# Patient Record
Sex: Male | Born: 1990 | Race: White | Hispanic: Yes | Marital: Married | Smoking: Former smoker
Health system: Southern US, Community
[De-identification: ages and names within clinical notes are randomized; demographics above are authoritative.]

## PROBLEM LIST (undated history)

## (undated) ENCOUNTER — Emergency Department: Admission: EM | Payer: 59 | Source: Home / Self Care

## (undated) DIAGNOSIS — M109 Gout, unspecified: Secondary | ICD-10-CM

## (undated) DIAGNOSIS — R569 Unspecified convulsions: Secondary | ICD-10-CM

## (undated) HISTORY — PX: TONSILLECTOMY: SUR1361

## (undated) HISTORY — PX: VASECTOMY: SHX75

---

## 2019-02-11 DIAGNOSIS — M79671 Pain in right foot: Secondary | ICD-10-CM | POA: Insufficient documentation

## 2019-02-11 DIAGNOSIS — M1009 Idiopathic gout, multiple sites: Secondary | ICD-10-CM | POA: Insufficient documentation

## 2019-02-11 DIAGNOSIS — M79672 Pain in left foot: Secondary | ICD-10-CM | POA: Insufficient documentation

## 2019-02-11 DIAGNOSIS — Z6839 Body mass index (BMI) 39.0-39.9, adult: Secondary | ICD-10-CM | POA: Insufficient documentation

## 2019-02-11 DIAGNOSIS — E669 Obesity, unspecified: Secondary | ICD-10-CM | POA: Insufficient documentation

## 2019-08-19 ENCOUNTER — Ambulatory Visit: Payer: 59 | Attending: Internal Medicine

## 2020-01-09 DIAGNOSIS — M7030 Other bursitis of elbow, unspecified elbow: Secondary | ICD-10-CM | POA: Insufficient documentation

## 2020-03-23 DIAGNOSIS — Z9852 Vasectomy status: Secondary | ICD-10-CM | POA: Insufficient documentation

## 2020-04-15 ENCOUNTER — Emergency Department (HOSPITAL_COMMUNITY): Payer: 59

## 2020-04-15 ENCOUNTER — Encounter (HOSPITAL_COMMUNITY): Payer: Self-pay

## 2020-04-15 ENCOUNTER — Other Ambulatory Visit: Payer: Self-pay

## 2020-04-15 ENCOUNTER — Emergency Department (HOSPITAL_COMMUNITY)
Admission: EM | Admit: 2020-04-15 | Discharge: 2020-04-15 | Disposition: A | Discharge status: Home / Self Care | Payer: 59 | Attending: Emergency Medicine | Admitting: Emergency Medicine

## 2020-04-15 ENCOUNTER — Other Ambulatory Visit (HOSPITAL_COMMUNITY): Payer: 59

## 2020-04-15 DIAGNOSIS — R110 Nausea: Secondary | ICD-10-CM | POA: Diagnosis not present

## 2020-04-15 DIAGNOSIS — F1590 Other stimulant use, unspecified, uncomplicated: Secondary | ICD-10-CM | POA: Insufficient documentation

## 2020-04-15 DIAGNOSIS — R569 Unspecified convulsions: Secondary | ICD-10-CM | POA: Insufficient documentation

## 2020-04-15 DIAGNOSIS — R519 Headache, unspecified: Secondary | ICD-10-CM | POA: Diagnosis not present

## 2020-04-15 HISTORY — DX: Gout, unspecified: M10.9

## 2020-04-15 LAB — RAPID URINE DRUG SCREEN, HOSP PERFORMED
Amphetamines: NOT DETECTED
Barbiturates: NOT DETECTED
Benzodiazepines: NOT DETECTED
Cocaine: NOT DETECTED
Opiates: NOT DETECTED
Tetrahydrocannabinol: POSITIVE — AB

## 2020-04-15 LAB — COMPREHENSIVE METABOLIC PANEL
ALT: 47 U/L — ABNORMAL HIGH (ref 0–44)
AST: 33 U/L (ref 15–41)
Albumin: 4.4 g/dL (ref 3.5–5.0)
Alkaline Phosphatase: 59 U/L (ref 38–126)
Anion gap: 10 (ref 5–15)
BUN: 12 mg/dL (ref 6–20)
CO2: 21 mmol/L — ABNORMAL LOW (ref 22–32)
Calcium: 9.6 mg/dL (ref 8.9–10.3)
Chloride: 107 mmol/L (ref 98–111)
Creatinine, Ser: 1.04 mg/dL (ref 0.61–1.24)
GFR, Estimated: >60 mL/min (ref >60)
Glucose, Bld: 148 mg/dL — ABNORMAL HIGH (ref 70–99)
Potassium: 4.1 mmol/L (ref 3.5–5.1)
Sodium: 138 mmol/L (ref 135–145)
Total Bilirubin: 0.7 mg/dL (ref 0.3–1.2)
Total Protein: 7.8 g/dL (ref 6.5–8.1)

## 2020-04-15 LAB — CBG MONITORING, ED: Glucose-Capillary: 159 mg/dL — ABNORMAL HIGH (ref 70–99)

## 2020-04-15 LAB — URINALYSIS, ROUTINE W REFLEX MICROSCOPIC
Bacteria, UA: NONE SEEN
Bilirubin Urine: NEGATIVE
Glucose, UA: NEGATIVE mg/dL
Ketones, ur: NEGATIVE mg/dL
Leukocytes,Ua: NEGATIVE
Nitrite: NEGATIVE
Protein, ur: 30 mg/dL — AB
Specific Gravity, Urine: 1.012 (ref 1.005–1.030)
pH: 5 (ref 5.0–8.0)

## 2020-04-15 LAB — CBC WITH DIFFERENTIAL/PLATELET
Abs Immature Granulocytes: 0.06 10*3/uL (ref 0.00–0.07)
Basophils Absolute: 0 10*3/uL (ref 0.0–0.1)
Basophils Relative: 0 %
Eosinophils Absolute: 0.3 10*3/uL (ref 0.0–0.5)
Eosinophils Relative: 3 %
HCT: 46 % (ref 39.0–52.0)
Hemoglobin: 15.4 g/dL (ref 13.0–17.0)
Immature Granulocytes: 1 %
Lymphocytes Relative: 16 %
Lymphs Abs: 1.6 10*3/uL (ref 0.7–4.0)
MCH: 30.9 pg (ref 26.0–34.0)
MCHC: 33.5 g/dL (ref 30.0–36.0)
MCV: 92.2 fL (ref 80.0–100.0)
Monocytes Absolute: 0.4 10*3/uL (ref 0.1–1.0)
Monocytes Relative: 4 %
Neutro Abs: 7.6 10*3/uL (ref 1.7–7.7)
Neutrophils Relative %: 76 %
Platelets: 227 10*3/uL (ref 150–400)
RBC: 4.99 MIL/uL (ref 4.22–5.81)
RDW: 12.4 % (ref 11.5–15.5)
WBC: 10 10*3/uL (ref 4.0–10.5)
nRBC: 0 % (ref 0.0–0.2)

## 2020-04-15 LAB — ACETAMINOPHEN LEVEL: Acetaminophen (Tylenol), Serum: <10 ug/mL — ABNORMAL LOW (ref 10–30)

## 2020-04-15 LAB — MAGNESIUM: Magnesium: 2.2 mg/dL (ref 1.7–2.4)

## 2020-04-15 LAB — SALICYLATE LEVEL: Salicylate Lvl: <7 mg/dL — ABNORMAL LOW (ref 7.0–30.0)

## 2020-04-15 MED ORDER — LEVETIRACETAM 750 MG PO TABS: 750.0000 mg | ORAL_TABLET | Freq: Two times a day (BID) | ORAL | 0 refills | Status: DC

## 2020-04-15 MED ORDER — SODIUM CHLORIDE 0.9 % IV BOLUS
1000.0000 mL | Freq: Once | INTRAVENOUS | Status: AC
  Administered 2020-04-15: 1000 mL via INTRAVENOUS

## 2020-04-15 MED ORDER — LEVETIRACETAM 750 MG PO TABS
750.0000 mg | ORAL_TABLET | Freq: Once | ORAL | Status: DC
  Filled 2020-04-15 (×3): qty 1

## 2020-04-15 MED ORDER — SODIUM CHLORIDE 0.9 % IV SOLN: INTRAVENOUS | Status: DC

## 2020-04-15 NOTE — Discharge Instructions (Addendum)
It was our pleasure to provide your ER care today - we hope that you feel better.  Follow up with neurologist in the coming week - call office tomorrow AM to arrange appointment.   Take keppra as prescribed - discuss medication with neurologist at follow up.  No driving, swimming, operating heavy machinery, or potentially dangerous activity until cleared to do so by your doctor/neurologist.  Return to ER if worse, new symptoms, new or severe pain, recurrent seizures, trouble breathing, or other concern.

## 2020-04-15 NOTE — ED Notes (Signed)
Pt in MRI unable to reasses vitals.

## 2020-04-15 NOTE — ED Notes (Signed)
Dr. Haviland at bedside. 

## 2020-04-15 NOTE — Progress Notes (Signed)
STAT EEG complete - results pending. ? ?

## 2020-04-15 NOTE — ED Notes (Signed)
This RN went with pt to and from CT.

## 2020-04-15 NOTE — Procedures (Signed)
Patient Name: Craig Davis.  MRN: [id number]  EEG Attending: Ritta Slot  Referring Physician/Provider: Marisue Humble Date: 04/15/2020 Duration: 27 minutes  Patient history: 29 yo M being evaluated for concern for seizure  Level of alertness: awake, asleep  AEDs during EEG study: None  Technical aspects: This EEG study was done with scalp electrodes positioned according to the 10-20 International system of electrode placement. Electrical activity was acquired at a sampling rate of 500Hz  and reviewed with a high frequency filter of 70Hz  and a low frequency filter of 1Hz . EEG data were recorded continuously and digitally stored.   BACKGROUND ACTIVITY: Posterior dominant rhythm: The posterior dominant rhythm consists of 9 Hz activity of moderate voltage (25-35 uV) seen predominantly in posterior head regions, symmetric and reactive to eye opening and eye closing.          Slowing: None  EPILEPTIFORM ACTIVITY: Interictal epileptiform activity: None  Ictal Activity: None  OTHER EVENTS: None  SLEEP RECORDINGS:  Sleep was recorded with symmetrically distributed structures.   ACTIVATION PROCEDURES:  Hyperventilation and photic stimulation were performed without abnormality.   IMPRESSION: This study is within normal limits. There was no seizure or evidence of seizure predisposition recorded on this study. Please note that lack of epileptiform activity on EEG does not preclude the possibility of epilepsy.    , MD [facility] [phone]  If 7pm- 7am, please page neurology on call as listed in AMION.

## 2020-04-15 NOTE — Progress Notes (Signed)
Pt is in MRI will try back later for EEG.

## 2020-04-15 NOTE — Consult Note (Signed)
Neurology Consult H&P  CC: First seizure  History is obtained from: patient and his wife  HPI: Craig Davis. is a 29 y.o. male right handed was sleeping and around 0630 noted by his wife to to be shaking all over. She describes flexion of arms and bilateral clonic extension of both legs with tongue biting on left followed by prolonged postictal. No urine/bowel incontinence.   He had associated mild headache and both of his legs are extremely sore especially the calves. No FH seizure except for niece after HIE. Good sleep. Occasionally smokes marijuana and denies other illicits and tobacco. Currently very stressed as they are buying a house.  ROS: A complete ROS was performed and is negative except as noted in the HPI.    Past Medical History:  Diagnosis Date  . Gout    Social History:  reports current drug use. Drug: Marijuana. No history on file for tobacco use and alcohol use.  Prior to Admission medications   Not on File   Exam: Current vital signs: BP 116/81   Pulse 73   Temp 97.6 F (36.4 C) (Oral)   Resp 18   Ht 6\' 3"  (1.905 m)   Wt 127 kg   SpO2 96%   BMI 35.00 kg/m   Physical Exam  Constitutional: Appears well-developed and well-nourished.  Psych: Affect appropriate to situation Eyes: No scleral injection HENT: No OP obstrucion Head: Normocephalic.  Cardiovascular: Normal rate and regular rhythm.  Respiratory: Effort normal and breath sounds normal to anterior ascultation GI: Soft.  No distension. There is no tenderness.  Skin: WDI  Neuro: Mental Status: Patient is awake, alert, oriented to person, place, month, year, and situation. Patient is able to give a clear and coherent history. No signs of aphasia or neglect. Cranial Nerves: II: Visual Fields are full. Pupils are equal, round, and reactive to light. III,IV, VI: EOMI without ptosis or diploplia.  V: Facial sensation is symmetric to temperature VII: Facial movement is symmetric.   VIII: hearing is intact to voice X: Uvula elevates symmetrically XI: Shoulder shrug is symmetric. XII: tongue is midline without atrophy or fasciculations.  Motor: Tone is normal. Bulk is normal. 5/5 strength was present in all four extremities. Sensory: Sensation is symmetric to light touch and temperature in the arms and legs. Deep Tendon Reflexes: 2+ and symmetric in the biceps and patellae. Plantars: Toes are downgoing bilaterally. Cerebellar: FNF and HKS are intact bilaterally.  I have reviewed labs in epic and the pertinent results are: No significant lab abnormalities CBC, BMP within normal reference range.  I have reviewed the images obtained: NCT head showed no acute ischemic changes, mass, hemorrhage.  Assessment: Craig Davis. is a 29 y.o. male no sig pmhx with first time seizure. From the limited semiology suggests seizure. Not clear if focal to bilateral versus generalized.  Plan: - 3T MRI brain wo cont - epilepsy protocol thin slices through temporal lobes.  - rEEG. - If either study is suggestive consider starting levetiracetam 750mg  two times daily.  Electronically signed by: Dr. 38 Pager: [phone] 04/15/2020, 1:56 PM

## 2020-04-15 NOTE — ED Notes (Signed)
Pt given water 

## 2020-04-15 NOTE — ED Provider Notes (Addendum)
Signed out by Dr Particia Nearing that workup complete except eeg. If eeg neg for seizure, d/c to home, start on keppra 750 mg bid, and neurology f/u as outpt.   EEG ready as negative/normal by neurologist. Pt asymptomatic, asking for food/drink - provided.  Pt currently appears stable for d/c.       Cathren Laine, MD 04/15/20 1921

## 2020-04-15 NOTE — ED Notes (Signed)
Pt states that he feels much better. Ambulating to restroom without assistance or difficulty.

## 2020-04-15 NOTE — ED Notes (Addendum)
Pt now complaining of light sensitivity, increasing headache, and nausea.  Dr. Particia Nearing made aware.

## 2020-04-15 NOTE — ED Triage Notes (Signed)
Per [facility]: Pt from home. This AM wife saw pt "shaking all over"  And not responsive followed by 10 minutes of confusion. NO hx of seizures. Complained of headache, nausea, and vomiting. Initial BP was 180/100. Pt "barely" has a headache now. Pt given 4 mg IV zofran with EMS.  Pt ambulated to restroom upon arrival to ED.  Pt denies alcohol use, denies drug use with the exception of some marijuana. Pt has bitten his tongue PTA.

## 2020-04-15 NOTE — ED Notes (Signed)
Pt back from CT

## 2020-04-15 NOTE — ED Notes (Signed)
Patient transported to MRI 

## 2020-04-15 NOTE — ED Provider Notes (Signed)
[facility] EMERGENCY DEPARTMENT Provider Note   CSN: [id number] Arrival date & time: 04/15/20  8315     History Chief Complaint  Patient presents with  . Seizures    Craig Davis. is a 29 y.o. male.  Pt presents to the ED today with a seizure.  Pt and his wife were in bed.  She woke up because he was shaking all over.  He would not respond, so she called EMS.  Pt was confused for about 10 minutes and is now back to nl.  He was ambulatory upon arrival to the ED.  Pt has no seizure hx.  He said he felt fine when he went to bed.  He c/o headache and nausea to EMS.  He was given 4 mg of zofran en route.  Pt's bp elevated for EMS, but is back to nl now.  No f/c.        Past Medical History:  Diagnosis Date  . Gout     There are no problems to display for this patient.   Past Surgical History:  Procedure Laterality Date  . TONSILLECTOMY    . VASECTOMY         No family history on file.  Social History   Tobacco Use  . Smoking status: Not on file  Substance Use Topics  . Alcohol use: Not on file  . Drug use: Yes    Types: Marijuana    Home Medications Prior to Admission medications   Not on File    Allergies    Patient has no known allergies.  Review of Systems   Review of Systems  Gastrointestinal: Positive for nausea and vomiting.  Neurological: Positive for seizures.  All other systems reviewed and are negative.   Physical Exam Updated Vital Signs BP 116/81   Pulse 73   Temp 97.6 F (36.4 C) (Oral)   Resp 18   Ht 6\' 3"  (1.905 m)   Wt 127 kg   SpO2 96%   BMI 35.00 kg/m   Physical Exam Vitals and nursing note reviewed.  Constitutional:      Appearance: Normal appearance.  HENT:     Head: Normocephalic and atraumatic.     Right Ear: External ear normal.     Left Ear: External ear normal.     Nose: Nose normal.     Mouth/Throat:     Mouth: Mucous membranes are moist.     Comments: Bite to left side of  tongue Eyes:     Extraocular Movements: Extraocular movements intact.     Conjunctiva/sclera: Conjunctivae normal.     Pupils: Pupils are equal, round, and reactive to light.  Cardiovascular:     Rate and Rhythm: Normal rate and regular rhythm.     Pulses: Normal pulses.     Heart sounds: Normal heart sounds.  Pulmonary:     Effort: Pulmonary effort is normal.     Breath sounds: Normal breath sounds.  Abdominal:     General: Abdomen is flat. Bowel sounds are normal.     Palpations: Abdomen is soft.  Musculoskeletal:        General: Normal range of motion.     Cervical back: Normal range of motion and neck supple.  Skin:    General: Skin is warm.     Capillary Refill: Capillary refill takes less than 2 seconds.  Neurological:     General: No focal deficit present.     Mental Status: He is  alert and oriented to person, place, and time.  Psychiatric:        Mood and Affect: Mood normal.        Behavior: Behavior normal.     ED Results / Procedures / Treatments   Labs (all labs ordered are listed, but only abnormal results are displayed) Labs Reviewed  COMPREHENSIVE METABOLIC PANEL - Abnormal; Notable for the following components:      Result Value   CO2 21 (*)    Glucose, Bld 148 (*)    ALT 47 (*)    All other components within normal limits  SALICYLATE LEVEL - Abnormal; Notable for the following components:   Salicylate Lvl <7.0 (*)    All other components within normal limits  ACETAMINOPHEN LEVEL - Abnormal; Notable for the following components:   Acetaminophen (Tylenol), Serum <10 (*)    All other components within normal limits  RAPID URINE DRUG SCREEN, HOSP PERFORMED - Abnormal; Notable for the following components:   Tetrahydrocannabinol POSITIVE (*)    All other components within normal limits  URINALYSIS, ROUTINE W REFLEX MICROSCOPIC - Abnormal; Notable for the following components:   Hgb urine dipstick SMALL (*)    Protein, ur 30 (*)    All other components  within normal limits  CBG MONITORING, ED - Abnormal; Notable for the following components:   Glucose-Capillary 159 (*)    All other components within normal limits  CBC WITH DIFFERENTIAL/PLATELET  MAGNESIUM    EKG EKG Interpretation  Date/Time:  Sunday April 15 2020 08:18:44 EST Ventricular Rate:  92 PR Interval:    QRS Duration: 99 QT Interval:  351 QTC Calculation: 435 R Axis:   44 Text Interpretation: Sinus rhythm ST elev, probable normal early repol pattern No old tracing to compare Confirmed by Jacalyn Lefevre [phone]) on 04/15/2020 8:44:29 AM   Radiology CT HEAD WO CONTRAST  Result Date: 04/15/2020 CLINICAL DATA:  Seizure EXAM: CT HEAD WITHOUT CONTRAST TECHNIQUE: Contiguous axial images were obtained from the base of the skull through the vertex without intravenous contrast. COMPARISON:  None. FINDINGS: Brain: No evidence of acute infarction, hemorrhage, hydrocephalus, extra-axial collection or mass lesion/mass effect. Vascular: No hyperdense vessel or unexpected calcification. Skull: Normal. Negative for fracture or focal lesion. Sinuses/Orbits: Mucosal thickening of the LEFT greater than RIGHT maxillary sinus. Other: None. IMPRESSION: No acute intracranial abnormality. Electronically Signed   By: Meda Klinefelter MD   On: 04/15/2020 09:56   DG Chest Port 1 View  Result Date: 04/15/2020 CLINICAL DATA:  Seizure. EXAM: PORTABLE CHEST 1 VIEW COMPARISON:  None. FINDINGS: 0847 hours. Low lung volumes. The lungs are clear without focal pneumonia, edema, pneumothorax or pleural effusion. The cardiopericardial silhouette is within normal limits for size. The visualized bony structures of the thorax show no acute abnormality. Telemetry leads overlie the chest. IMPRESSION: Low volume chest without acute cardiopulmonary findings. Electronically Signed   By: Kennith Center M.D.   On: 04/15/2020 09:26    Procedures Procedures (including critical care time)  Medications Ordered in  ED Medications  sodium chloride 0.9 % bolus 1,000 mL (0 mLs Intravenous Stopped 04/15/20 1002)    And  0.9 %  sodium chloride infusion (has no administration in time range)    ED Course  I have reviewed the triage vital signs and the nursing notes.  Pertinent labs & imaging results that were available during my care of the patient were reviewed by me and considered in my medical decision making (see chart for details).  MDM Rules/Calculators/A&P                          Pt d/w Dr. Thomasena Edis (neurology) who recommended a MRI and an EEG.  MRI and EEG are pending at shift change.  If the EEG or MRI suggests a seizure d/o, he recommends starting keppra 750 mg bid.  Pt signed out to Dr. Denton Lank at shift change. Final Clinical Impression(s) / ED Diagnoses Final diagnoses:  Seizure [facility])    Rx / DC Orders ED Discharge Orders    None       Jacalyn Lefevre, MD 04/15/20 [id number]

## 2020-05-02 ENCOUNTER — Ambulatory Visit: Payer: 59 | Admitting: Podiatry

## 2020-05-07 ENCOUNTER — Ambulatory Visit: Payer: 59 | Admitting: Podiatry

## 2020-05-07 ENCOUNTER — Ambulatory Visit (INDEPENDENT_AMBULATORY_CARE_PROVIDER_SITE_OTHER): Payer: 59

## 2020-05-07 ENCOUNTER — Encounter: Payer: Self-pay | Admitting: Podiatry

## 2020-05-07 ENCOUNTER — Other Ambulatory Visit: Payer: Self-pay

## 2020-05-07 DIAGNOSIS — M722 Plantar fascial fibromatosis: Secondary | ICD-10-CM | POA: Diagnosis not present

## 2020-05-07 DIAGNOSIS — R569 Unspecified convulsions: Secondary | ICD-10-CM | POA: Insufficient documentation

## 2020-05-07 DIAGNOSIS — M1A0790 Idiopathic chronic gout, unspecified ankle and foot, without tophus (tophi): Secondary | ICD-10-CM | POA: Diagnosis not present

## 2020-05-07 MED ORDER — MELOXICAM 15 MG PO TABS: 15.0000 mg | ORAL_TABLET | Freq: Every day | ORAL | 3 refills | Status: DC

## 2020-05-07 NOTE — Progress Notes (Signed)
  Subjective:  Patient ID: Craig Burly., male    DOB: 1990/07/05,  MRN: [id number] HPI Chief Complaint  Patient presents with  . Foot Pain    Plantar heels bilateral and other multiple areas - aching x years, severity of pain comes and goes, history of gout, AM pain, takes Allopurnil daily, some ankle pain, wife massages  . New Patient (Initial Visit)    29 y.o. male presents with the above complaint.   ROS: Denies fever chills nausea vomiting muscle aches pains calf pain back pain chest pain shortness of breath.  Past Medical History:  Diagnosis Date  . Gout    Past Surgical History:  Procedure Laterality Date  . TONSILLECTOMY    . VASECTOMY      Current Outpatient Medications:  .  allopurinol (ZYLOPRIM) 100 MG tablet, Take 100 mg by mouth daily., Disp: , Rfl:  .  diazepam (VALIUM) 10 MG tablet, Take 10 mg by mouth 2 (two) times daily as needed., Disp: , Rfl:  .  diclofenac Sodium (VOLTAREN) 1 % GEL, Apply 1 application topically 4 (four) times daily., Disp: , Rfl:  .  levETIRAcetam (KEPPRA) 750 MG tablet, Take 1 tablet (750 mg total) by mouth 2 (two) times daily., Disp: 60 tablet, Rfl: 0 .  meloxicam (MOBIC) 15 MG tablet, Take 1 tablet (15 mg total) by mouth daily., Disp: 30 tablet, Rfl: 3  No Known Allergies Review of Systems Objective:  There were no vitals filed for this visit.  General: Well developed, nourished, in no acute distress, alert and oriented x3   Dermatological: Skin is warm, dry and supple bilateral. Nails x 10 are well maintained; remaining integument appears unremarkable at this time. There are no open sores, no preulcerative lesions, no rash or signs of infection present.  Vascular: Dorsalis Pedis artery and Posterior Tibial artery pedal pulses are 2/4 bilateral with immedate capillary fill time. Pedal hair growth present. No varicosities and no lower extremity edema present bilateral.   Neruologic: Grossly intact via light touch bilateral.  Vibratory intact via tuning fork bilateral. Protective threshold with Semmes Wienstein monofilament intact to all pedal sites bilateral. Patellar and Achilles deep tendon reflexes 2+ bilateral. No Babinski or clonus noted bilateral.   Musculoskeletal: No gross boney pedal deformities bilateral. No pain, crepitus, or limitation noted with foot and ankle range of motion bilateral. Muscular strength 5/5 in all groups tested bilateral.  No reproducible pain at this point.  Though this could very well been gouty arthritis.  Mild pes planovalgus bilateral.  Gait: Unassisted, Nonantalgic.    Radiographs:  Radiographs taken today demonstrate an osseously mature individual with no acute findings.  Plan fascia appears normal.  Mild pes planovalgus bilateral.  Osteoarthritic changes subtalar joints bilateral.  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    Plan: Started him on meloxicam and will get his arthritic profile performed.     Craig Davis T. [place], [place]

## 2020-05-08 LAB — CBC WITH DIFFERENTIAL/PLATELET
Basophils Absolute: 0.1 10*3/uL (ref 0.0–0.2)
Basos: 1 %
EOS (ABSOLUTE): 0.3 10*3/uL (ref 0.0–0.4)
Eos: 4 %
Hematocrit: 42.7 % (ref 37.5–51.0)
Hemoglobin: 15.2 g/dL (ref 13.0–17.7)
Immature Grans (Abs): 0 10*3/uL (ref 0.0–0.1)
Immature Granulocytes: 0 %
Lymphocytes Absolute: 3 10*3/uL (ref 0.7–3.1)
Lymphs: 33 %
MCH: 31.9 pg (ref 26.6–33.0)
MCHC: 35.6 g/dL (ref 31.5–35.7)
MCV: 90 fL (ref 79–97)
Monocytes Absolute: 0.7 10*3/uL (ref 0.1–0.9)
Monocytes: 8 %
Neutrophils Absolute: 5 10*3/uL (ref 1.4–7.0)
Neutrophils: 54 %
Platelets: 273 10*3/uL (ref 150–450)
RBC: 4.76 x10E6/uL (ref 4.14–5.80)
RDW: 12 % (ref 11.6–15.4)
WBC: 9.1 10*3/uL (ref 3.4–10.8)

## 2020-05-08 LAB — RHEUMATOID FACTOR: Rheumatoid fact SerPl-aCnc: <10 IU/mL (ref <14.0)

## 2020-05-08 LAB — URIC ACID: Uric Acid: 9.9 mg/dL — ABNORMAL HIGH (ref 3.8–8.4)

## 2020-05-08 LAB — ANA: Anti Nuclear Antibody (ANA): NEGATIVE

## 2020-05-08 LAB — SEDIMENTATION RATE: Sed Rate: 10 mm/hr (ref 0–15)

## 2020-05-08 LAB — C-REACTIVE PROTEIN: CRP: 3 mg/L (ref 0–10)

## 2020-05-18 ENCOUNTER — Telehealth: Payer: Self-pay

## 2020-05-18 NOTE — Telephone Encounter (Signed)
Patient has been notified of results.  He stated that he did start the Allopurinol and Meloxicam as Dr. Al Corpus recommended and he is doing better, has not had any problems other than feeling tired and a little achy after work.  He will follow up with Dr. Al Corpus on 06/06/2020

## 2020-05-18 NOTE — Telephone Encounter (Signed)
-----   Message from Elinor Parkinson, [place] sent at 05/09/2020  7:12 AM EST ----- Uric acid levels are high.  Needs to get back on the allopurinol and stay on it.

## 2020-05-28 ENCOUNTER — Ambulatory Visit: Payer: 59 | Admitting: Diagnostic Neuroimaging

## 2020-05-28 ENCOUNTER — Encounter: Payer: Self-pay | Admitting: Diagnostic Neuroimaging

## 2020-05-28 VITALS — BP 118/74 | HR 74 | Ht 75.0 in | Wt 296.0 lb

## 2020-05-28 DIAGNOSIS — R569 Unspecified convulsions: Secondary | ICD-10-CM | POA: Diagnosis not present

## 2020-05-28 NOTE — Patient Instructions (Signed)
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   - Please maintain precautions. Do not participate in activities where a loss of awareness could harm you or someone else. No swimming alone, no tub bathing, no hot tubs, no driving, no operating motorized vehicles (cars, ATVs, motocycles, etc), lawnmowers, power tools or firearms. No standing at heights, such as rooftops, ladders or stairs. Avoid hot objects such as stoves, heaters, open fires. Wear a helmet when riding a bicycle, scooter, skateboard, etc. and avoid areas of traffic. Set your water heater to 120 degrees or less.

## 2020-05-28 NOTE — Progress Notes (Signed)
[facility]  PATIENT: Craig Davis. DOB: Oct 24, 1990  REFERRING CLINICIAN: No ref. provider found HISTORY FROM: patient  REASON FOR VISIT: new consult    HISTORICAL  CHIEF COMPLAINT:  Chief Complaint  Patient presents with  . Seizures    Rm 7 New Pt "possible seizure 04/15/20 witnessed by wife, doesn't remember but had N/V afterwards, bit his tongue"    HISTORY OF PRESENT ILLNESS:   29 year old male here for evaluation of seizure.  04/15/2020 patient was asleep when he had a seizure that was witnessed by wife.  He had some tongue biting, sore muscles and nausea afterwards.  He was having some trouble getting his words out initially after the seizure.  Patient went to the hospital for evaluation.  Neurology consult was obtained.  Although EEG and MRI were normal, for some reason patient was prescribed levetiracetam 7 or she is milligrams twice a day.  Patient did not fill this prescription.  Since that time he is doing well.  No further events.  Possible triggering factors include increased stress and increased work leading up to this event.  Patient was drinking 2 energy drinks per day and working 2 jobs.  No family history of seizure.  No similar prior events.   REVIEW OF SYSTEMS: Full 14 system review of systems performed and negative with exception of: As per HPI.  ALLERGIES: No Known Allergies  HOME MEDICATIONS: Outpatient Medications Prior to Visit  Medication Sig Dispense Refill  . allopurinol (ZYLOPRIM) 100 MG tablet Take 100 mg by mouth daily.    . meloxicam (MOBIC) 15 MG tablet Take 1 tablet (15 mg total) by mouth daily. 30 tablet 3  . diclofenac Sodium (VOLTAREN) 1 % GEL Apply 1 application topically 4 (four) times daily. (Patient not taking: Reported on 05/28/2020)    . levETIRAcetam (KEPPRA) 750 MG tablet Take 1 tablet (750 mg total) by mouth 2 (two) times daily. (Patient not taking: Reported on 05/28/2020) 60 tablet 0  . diazepam  (VALIUM) 10 MG tablet Take 10 mg by mouth 2 (two) times daily as needed.     No facility-administered medications prior to visit.    PAST MEDICAL HISTORY: Past Medical History:  Diagnosis Date  . Gout     PAST SURGICAL HISTORY: Past Surgical History:  Procedure Laterality Date  . TONSILLECTOMY    . VASECTOMY      FAMILY HISTORY: Family History  Problem Relation Age of Onset  . Autism Brother     SOCIAL HISTORY: Social History   Socioeconomic History  . Marital status: Married    Spouse name: Duwayne Heck  . Number of children: 1  . Years of education: Not on file  . Highest education level: High school graduate  Occupational History  . Not on file  Tobacco Use  . Smoking status: Never Smoker  . Smokeless tobacco: Never Used  . Tobacco comment: 2 years when younger  Substance and Sexual Activity  . Alcohol use: Yes    Comment: 05/28/20 not often  . Drug use: Yes    Types: Marijuana    Comment: 05/28/20 3 x week  . Sexual activity: Not on file  Other Topics Concern  . Not on file  Social History Narrative   Lives with wife, child   05/28/20 Caffeine- was drinking ES energy drinks 1-2 daily, now 1 c coffee a day   Social Determinants of Health   Financial Resource Strain: Not on file  Food Insecurity: Not on file  Transportation Needs: Not on file  Physical Activity: Not on file  Stress: Not on file  Social Connections: Not on file  Intimate Partner Violence: Not on file     PHYSICAL EXAM  GENERAL EXAM/CONSTITUTIONAL: Vitals:  Vitals:   05/28/20 0812  BP: 118/74  Pulse: 74  Weight: 296 lb (134.3 kg)  Height: 6\' 3"  (1.905 m)     Body mass index is 37 kg/m. Wt Readings from Last 3 Encounters:  05/28/20 296 lb (134.3 kg)  04/15/20 280 lb (127 kg)     Patient is in no distress; well developed, nourished and groomed; neck is supple  CARDIOVASCULAR:  Examination of carotid arteries is normal; no carotid bruits  Regular rate and rhythm, no  murmurs  Examination of peripheral vascular system by observation and palpation is normal  EYES:  Ophthalmoscopic exam of optic discs and posterior segments is normal; no papilledema or hemorrhages  No exam data present  MUSCULOSKELETAL:  Gait, strength, tone, movements noted in Neurologic exam below  NEUROLOGIC: MENTAL STATUS:  No flowsheet data found.  awake, alert, oriented to person, place and time  recent and remote memory intact  normal attention and concentration  language fluent, comprehension intact, naming intact  fund of knowledge appropriate  CRANIAL NERVE:   2nd - no papilledema on fundoscopic exam  2nd, 3rd, 4th, 6th - pupils equal and reactive to light, visual fields full to confrontation, extraocular muscles intact, no nystagmus  5th - facial sensation symmetric  7th - facial strength symmetric  8th - hearing intact  9th - palate elevates symmetrically, uvula midline  11th - shoulder shrug symmetric  12th - tongue protrusion midline  MOTOR:   normal bulk and tone, full strength in the BUE, BLE  SENSORY:   normal and symmetric to light touch, temperature, vibration  COORDINATION:   finger-nose-finger, fine finger movements normal  REFLEXES:   deep tendon reflexes present and symmetric  GAIT/STATION:   narrow based gait     DIAGNOSTIC DATA (LABS, IMAGING, TESTING) - I reviewed patient records, labs, notes, testing and imaging myself where available.  Lab Results  Component Value Date   WBC 9.1 05/07/2020   HGB 15.2 05/07/2020   HCT 42.7 05/07/2020   MCV 90 05/07/2020   PLT 273 05/07/2020      Component Value Date/Time   NA 138 04/15/2020 0826   K 4.1 04/15/2020 0826   CL 107 04/15/2020 0826   CO2 21 (L) 04/15/2020 0826   GLUCOSE 148 (H) 04/15/2020 0826   BUN 12 04/15/2020 0826   CREATININE 1.04 04/15/2020 0826   CALCIUM 9.6 04/15/2020 0826   PROT 7.8 04/15/2020 0826   ALBUMIN 4.4 04/15/2020 0826   AST 33  04/15/2020 0826   ALT 47 (H) 04/15/2020 0826   ALKPHOS 59 04/15/2020 0826   BILITOT 0.7 04/15/2020 0826   GFRNONAA >60 04/15/2020 0826   No results found for: CHOL, HDL, LDLCALC, LDLDIRECT, TRIG, CHOLHDL No results found for: 04/17/2020 No results found for: VITAMINB12 No results found for: TSH    04/15/20 MRI brain 1. Partially empty sella, often an incidental finding though can be seen with idiopathic intracranial hypertension. 2. Otherwise unremarkable appearance of the brain. No etiology of seizures identified.  04/15/20 EEG  - normal    ASSESSMENT AND PLAN  29 y.o. year old male here with new onset single unprovoked seizure on 04/15/2020 during sleep.  MRI and EEG were normal.   Dx:  1. New onset seizure (HCC)  PLAN:  NEW ONSET SEIZURE (single, unprovoked; 04/15/20)  - monitor for now (no anti-seizure meds)   - According to [place] law, you can not drive unless you are seizure / syncope free for at least 6 months and under physician's care.   - Please maintain precautions. Do not participate in activities where a loss of awareness could harm you or someone else. No swimming alone, no tub bathing, no hot tubs, no driving, no operating motorized vehicles (cars, ATVs, motocycles, etc), lawnmowers, power tools or firearms. No standing at heights, such as rooftops, ladders or stairs. Avoid hot objects such as stoves, heaters, open fires. Wear a helmet when riding a bicycle, scooter, skateboard, etc. and avoid areas of traffic. Set your water heater to 120 degrees or less.   Return for pending if symptoms worsen or fail to improve.    Suanne Marker, MD 05/28/2020, 9:02 AM Certified in Neurology, Neurophysiology and Neuroimaging  [facility] [address] [place], [place] [address] [phone]

## 2020-06-06 ENCOUNTER — Ambulatory Visit: Payer: 59 | Admitting: Podiatry

## 2020-06-06 ENCOUNTER — Other Ambulatory Visit: Payer: 59 | Admitting: Orthotics

## 2020-06-18 ENCOUNTER — Ambulatory Visit: Payer: 59 | Admitting: Podiatry

## 2020-06-20 ENCOUNTER — Encounter: Payer: Self-pay | Admitting: Podiatry

## 2020-06-20 ENCOUNTER — Other Ambulatory Visit: Payer: Self-pay

## 2020-06-20 ENCOUNTER — Ambulatory Visit: Payer: 59 | Admitting: Podiatry

## 2020-06-20 DIAGNOSIS — M1A0720 Idiopathic chronic gout, left ankle and foot, without tophus (tophi): Secondary | ICD-10-CM | POA: Diagnosis not present

## 2020-06-20 DIAGNOSIS — M19072 Primary osteoarthritis, left ankle and foot: Secondary | ICD-10-CM | POA: Diagnosis not present

## 2020-06-20 NOTE — Patient Instructions (Addendum)
Continue taking the meloxicam as needed  Continue taking the allopurinol and managing your gout with your primary care doctor    Low-Purine Eating Plan A low-purine eating plan involves making food choices to limit your intake of purine. Purine is a kind of uric acid. Too much uric acid in your blood can cause certain conditions, such as gout and kidney stones. Eating a low-purine diet can help control these conditions. What are tips for following this plan? Reading food labels  Avoid foods with saturated or Trans fat.  Check the ingredient list of grains-based foods, such as bread and cereal, to make sure that they contain whole grains.  Check the ingredient list of sauces or soups to make sure they do not contain meat or fish.  When choosing soft drinks, check the ingredient list to make sure they do not contain high-fructose corn syrup. Shopping  Buy plenty of fresh fruits and vegetables.  Avoid buying canned or fresh fish.  Buy dairy products labeled as low-fat or nonfat.  Avoid buying premade or processed foods. These foods are often high in fat, salt (sodium), and added sugar.   Cooking  Use olive oil instead of butter when cooking. Oils like olive oil, canola oil, and sunflower oil contain healthy fats. Meal planning  Learn which foods do or do not affect you. If you find out that a food tends to cause your gout symptoms to flare up, avoid eating that food. You can enjoy foods that do not cause problems. If you have any questions about a food item, talk with your dietitian or health care provider.  Limit foods high in fat, especially saturated fat. Fat makes it harder for your body to get rid of uric acid.  Choose foods that are lower in fat and are lean sources of protein. General guidelines  Limit alcohol intake to no more than 1 drink a day for nonpregnant women and 2 drinks a day for men. One drink equals 12 oz of beer, 5 oz of wine, or 1 oz of hard liquor. Alcohol  can affect the way your body gets rid of uric acid.  Drink plenty of water to keep your urine clear or pale yellow. Fluids can help remove uric acid from your body.  If directed by your health care provider, take a vitamin C supplement.  Work with your health care provider and dietitian to develop a plan to achieve or maintain a healthy weight. Losing weight can help reduce uric acid in your blood. What foods are recommended? The items listed may not be a complete list. Talk with your dietitian about what dietary choices are best for you. Foods low in purines Foods low in purines do not need to be limited. These include:  All fruits.  All low-purine vegetables, pickles, and olives.  Breads, pasta, rice, cornbread, and popcorn. Cake and other baked goods.  All dairy foods.  Eggs, nuts, and nut butters.  Spices and condiments, such as salt, herbs, and vinegar.  Plant oils, butter, and margarine.  Water, sugar-free soft drinks, tea, coffee, and cocoa.  Vegetable-based soups, broths, sauces, and gravies. Foods moderate in purines Foods moderate in purines should be limited to the amounts listed.   cup of asparagus, cauliflower, spinach, mushrooms, or green peas, each day.  2/3 cup uncooked oatmeal, each day.   cup dry wheat bran or wheat germ, each day.  2-3 ounces of meat or poultry, each day.  4-6 ounces of shellfish, such as crab, lobster,  oysters, or shrimp, each day.  1 cup cooked beans, peas, or lentils, each day.  Soup, broths, or bouillon made from meat or fish. Limit these foods as much as possible. What foods are not recommended? The items listed may not be a complete list. Talk with your dietitian about what dietary choices are best for you. Limit your intake of foods high in purines, including:  Beer and other alcohol.  Meat-based gravy or sauce.  Canned or fresh fish, such as: ? Anchovies, sardines, herring, and tuna. ? Mussels and  scallops. ? Codfish, trout, and haddock.  Tomasa Blase.  Organ meats, such as: ? Liver or kidney. ? Tripe. ? Sweetbreads (thymus gland or pancreas).  Wild Education officer, environmental.  Yeast or yeast extract supplements.  Drinks sweetened with high-fructose corn syrup. Summary  Eating a low-purine diet can help control conditions caused by too much uric acid in the body, such as gout or kidney stones.  Choose low-purine foods, limit alcohol, and limit foods high in fat.  You will learn over time which foods do or do not affect you. If you find out that a food tends to cause your gout symptoms to flare up, avoid eating that food. This information is not intended to replace advice given to you by your health care provider. Make sure you discuss any questions you have with your health care provider. Document Revised: 09/01/2019 Document Reviewed: 09/01/2019 Elsevier Patient Education  2021 [employer].

## 2020-06-21 NOTE — Progress Notes (Signed)
  Subjective:  Patient ID: Craig Burly., male    DOB: 08/08/1990,  MRN: [id number]  Chief Complaint  Patient presents with  . Plantar Fasciitis    Follow up plantar fasciitis, gout   "its doing much better since I started the Meloxicam"    30 y.o. male presents with the above complaint. History confirmed with patient.  Doing well.  He is not having pain with meloxicam.  He did stop taking it for a day or 2 and the pain returned.  He is back on allopurinol Objective:  Physical Exam: warm, good capillary refill, no trophic changes or ulcerative lesions, normal DP and PT pulses and normal sensory exam. Left Foot: Limited range of motion of the subtalar and ankle joints  Uric acid 9.9 Assessment:   1. Chronic gout of left foot, unspecified cause      Plan:  Patient was evaluated and treated and all questions answered.   Doing much better now that he is back on his allopurinol.  I recommend he continue taking this and continue his chronic gout management with his PCP.  He should continue to take meloxicam as needed.  Discussed with him if it returns and he has acute gout flares at we could perform injections as needed hopefully will not have any flares further.  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  If it worsens he will return for consideration  Return if symptoms worsen or fail to improve.

## 2020-07-24 ENCOUNTER — Ambulatory Visit: Payer: 59 | Admitting: Podiatry

## 2020-07-24 ENCOUNTER — Other Ambulatory Visit: Payer: Self-pay

## 2020-07-24 ENCOUNTER — Encounter: Payer: Self-pay | Admitting: Podiatry

## 2020-07-24 DIAGNOSIS — M7671 Peroneal tendinitis, right leg: Secondary | ICD-10-CM

## 2020-07-24 MED ORDER — BETAMETHASONE SOD PHOS & ACET 6 (3-3) MG/ML IJ SUSP
3.0000 mg | Freq: Once | INTRAMUSCULAR | Status: AC
  Administered 2020-07-24: 3 mg via INTRA_ARTICULAR

## 2020-07-24 MED ORDER — METHYLPREDNISOLONE 4 MG PO TBPK: ORAL_TABLET | ORAL | 0 refills | Status: DC

## 2020-07-24 MED ORDER — MELOXICAM 15 MG PO TABS: 15.0000 mg | ORAL_TABLET | Freq: Every day | ORAL | 1 refills | Status: DC

## 2020-07-24 NOTE — Progress Notes (Signed)
   HPI: 30 y.o. male presenting today for a new onset of pain to the lateral aspect of the right ankle has been going on for a few weeks now.  Patient states that he may have an acute gout flareup.  He feels very hot and swollen and tender.  He has been taken meloxicam in the past with minimal improvement.  He takes allopurinol but would like to increase the dose.  He presents for further treatment and evaluation  Past Medical History:  Diagnosis Date  . Gout      Physical Exam: General: The patient is alert and oriented x3 in no acute distress.  Dermatology: Skin is warm, dry and supple bilateral lower extremities. Negative for open lesions or macerations.  Vascular: Palpable pedal pulses bilaterally. No edema or erythema noted. Capillary refill within normal limits.  Neurological: Epicritic and protective threshold grossly intact bilaterally.   Musculoskeletal Exam: Range of motion within normal limits to all pedal and ankle joints bilateral. Muscle strength 5/5 in all groups bilateral.  Pain on palpation along the peroneal tendon sheath right as it traverses the fibular malleolus   Assessment: 1.  Peroneal tendinitis right vs. acute gout right.  Suspect peroneal tendinitis over gout   Plan of Care:  1. Patient evaluated.  2.  Injection of 0.5 cc Celestone Soluspan injected along the peroneal tendon sheath just posterior to the lateral malleolus right 3.  Prescription for Medrol Dosepak 4.  Prescription for meloxicam 15 mg take daily 5.  Ankle brace dispensed.  Wear daily 6.  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  From [place].  Married with a 37-year-old      Felecia Shelling, DPM [facility]  Dr. Felecia Shelling, DPM    [address] [place], [place] [address]                Office [phone]  Fax [phone]

## 2020-08-21 ENCOUNTER — Ambulatory Visit: Payer: 59 | Admitting: Podiatry

## 2020-09-04 ENCOUNTER — Ambulatory Visit: Payer: 59 | Admitting: Podiatry

## 2020-09-20 ENCOUNTER — Telehealth: Payer: Self-pay | Admitting: *Deleted

## 2020-09-20 NOTE — Telephone Encounter (Signed)
"  I would like to get my prescription sent to another pharmacy."  I am returning your call.  You want to change your pharmacy.  "It is [facility] in [place] on [address].  I need a refill of the Meloxicam.  Can you send that to this pharmacy?"  I will send a message to the doctor.

## 2020-09-21 MED ORDER — MELOXICAM 15 MG PO TABS: 15.0000 mg | ORAL_TABLET | Freq: Every day | ORAL | 1 refills | Status: DC

## 2020-09-21 NOTE — Telephone Encounter (Signed)
I'm calling to let you know that Dr. Lilian Kapur sent your prescription to [facility] in [place].  "Okay, thank you very much."

## 2020-09-21 NOTE — Addendum Note (Signed)
Addended byLilian Kapur, Tirzah Fross R on: 09/21/2020 08:24 AM   Modules accepted: Orders

## 2020-09-26 ENCOUNTER — Ambulatory Visit: Payer: 59 | Admitting: Podiatry

## 2021-06-05 ENCOUNTER — Ambulatory Visit: Payer: 59 | Admitting: Podiatry

## 2021-07-21 ENCOUNTER — Other Ambulatory Visit: Payer: Self-pay | Admitting: Podiatry

## 2021-08-18 ENCOUNTER — Other Ambulatory Visit: Payer: Self-pay | Admitting: Podiatry

## 2021-09-21 ENCOUNTER — Other Ambulatory Visit: Payer: Self-pay | Admitting: Podiatry

## 2021-10-03 ENCOUNTER — Emergency Department
Admission: EM | Admit: 2021-10-03 | Discharge: 2021-10-03 | Discharge status: Against Medical Advice | Payer: 59 | Attending: Emergency Medicine | Admitting: Emergency Medicine

## 2021-10-03 ENCOUNTER — Other Ambulatory Visit: Payer: Self-pay

## 2021-10-03 DIAGNOSIS — R569 Unspecified convulsions: Secondary | ICD-10-CM | POA: Diagnosis not present

## 2021-10-03 DIAGNOSIS — Z5321 Procedure and treatment not carried out due to patient leaving prior to being seen by health care provider: Secondary | ICD-10-CM | POA: Insufficient documentation

## 2021-10-03 HISTORY — DX: Unspecified convulsions: R56.9

## 2021-10-03 LAB — COMPREHENSIVE METABOLIC PANEL
ALT: 76 U/L — ABNORMAL HIGH (ref 0–44)
AST: 58 U/L — ABNORMAL HIGH (ref 15–41)
Albumin: 4.6 g/dL (ref 3.5–5.0)
Alkaline Phosphatase: 59 U/L (ref 38–126)
Anion gap: 10 (ref 5–15)
BUN: 14 mg/dL (ref 6–20)
CO2: 23 mmol/L (ref 22–32)
Calcium: 9.6 mg/dL (ref 8.9–10.3)
Chloride: 105 mmol/L (ref 98–111)
Creatinine, Ser: 1.05 mg/dL (ref 0.61–1.24)
GFR, Estimated: >60 mL/min (ref >60)
Glucose, Bld: 115 mg/dL — ABNORMAL HIGH (ref 70–99)
Potassium: 5.2 mmol/L — ABNORMAL HIGH (ref 3.5–5.1)
Sodium: 138 mmol/L (ref 135–145)
Total Bilirubin: 0.8 mg/dL (ref 0.3–1.2)
Total Protein: 8 g/dL (ref 6.5–8.1)

## 2021-10-03 LAB — CBC WITH DIFFERENTIAL/PLATELET
Abs Immature Granulocytes: 0.04 10*3/uL (ref 0.00–0.07)
Basophils Absolute: 0 10*3/uL (ref 0.0–0.1)
Basophils Relative: 0 %
Eosinophils Absolute: 0.3 10*3/uL (ref 0.0–0.5)
Eosinophils Relative: 3 %
HCT: 45.5 % (ref 39.0–52.0)
Hemoglobin: 15.5 g/dL (ref 13.0–17.0)
Immature Granulocytes: 1 %
Lymphocytes Relative: 18 %
Lymphs Abs: 1.4 10*3/uL (ref 0.7–4.0)
MCH: 30.5 pg (ref 26.0–34.0)
MCHC: 34.1 g/dL (ref 30.0–36.0)
MCV: 89.6 fL (ref 80.0–100.0)
Monocytes Absolute: 0.5 10*3/uL (ref 0.1–1.0)
Monocytes Relative: 6 %
Neutro Abs: 5.7 10*3/uL (ref 1.7–7.7)
Neutrophils Relative %: 72 %
Platelets: 253 10*3/uL (ref 150–400)
RBC: 5.08 MIL/uL (ref 4.22–5.81)
RDW: 12.4 % (ref 11.5–15.5)
WBC: 7.9 10*3/uL (ref 4.0–10.5)
nRBC: 0 % (ref 0.0–0.2)

## 2021-10-03 LAB — CK: Total CK: 365 U/L (ref 49–397)

## 2021-10-03 MED ORDER — SODIUM CHLORIDE 0.9 % IV BOLUS: 1000.0000 mL | Freq: Once | INTRAVENOUS | Status: DC

## 2021-10-03 NOTE — ED Triage Notes (Signed)
First nurse note:  Pt to ED via EMS from home c/o seizure this morning.  Wife woke up to pt seizing for approx 2 minutes.  Last seizure for patient was last year, does not take seizure medication, no memory of this seizure, no incontinence, some oral trauma.  EMS vitals 128/78 BP, 114 HR, 107 CBG, 95% RA. ?

## 2021-10-03 NOTE — ED Notes (Signed)
Pt called from lobby to go to room. Pt not present in lobby. Checked with firse nurse Judeth Cornfield who stated pt left before being seen.  ?

## 2021-10-03 NOTE — ED Triage Notes (Signed)
Pt comes into the ED via EMS from home, wife is with the pt, states she woke up in bed with the pt making a strange noise and the top half of his body shaking lasting for about a minute, states he had one other seizure in 2021 and they followed up, said everything was normal. Pt states he just feels sore all over and his tongue is sore from biting it. Denies any recent illness, pt is a/ox4 at present. ?

## 2021-11-13 ENCOUNTER — Ambulatory Visit
Admission: EM | Admit: 2021-11-13 | Discharge: 2021-11-13 | Disposition: A | Discharge status: Home / Self Care | Payer: 59

## 2021-11-13 ENCOUNTER — Encounter: Payer: Self-pay | Admitting: Emergency Medicine

## 2021-11-13 DIAGNOSIS — M7989 Other specified soft tissue disorders: Secondary | ICD-10-CM

## 2021-11-13 MED ORDER — KETOROLAC TROMETHAMINE 60 MG/2ML IM SOLN
30.0000 mg | Freq: Once | INTRAMUSCULAR | Status: AC
  Administered 2021-11-13: 30 mg via INTRAMUSCULAR

## 2021-11-13 NOTE — ED Provider Notes (Signed)
[facility] Provider Note  Patient Contact: 3:06 PM (approximate)   History   Arm Swelling    HPI  Craig Plummer Matich. is a 31 y.o. male presents to the emergency department with right forearm swelling that started acutely this morning.  Patient denies associated erythema.  He states that he recently started working out more and his last workout involves some push-ups.  He denies recent surgery, prolonged immobilization or recent travel.  He states that he does vape daily but has no history of upper extremity or lower extremity DVT or PE.  Denies chest pain, chest tightness or shortness of breath.  Denies starting any new medications.  No falls.      Physical Exam   Triage Vital Signs: ED Triage Vitals [11/13/21 1441]  Enc Vitals Group     BP 115/72     Pulse Rate 83     Resp 16     Temp 97.9 F (36.6 C)     Temp Source Oral     SpO2 95 %     Weight      Height      Head Circumference      Peak Flow      Pain Score 0     Pain Loc      Pain Edu?      Excl. in GC?     Most recent vital signs: Vitals:   11/13/21 1441  BP: 115/72  Pulse: 83  Resp: 16  Temp: 97.9 F (36.6 C)  SpO2: 95%     General: Alert and in no acute distress. Eyes:  PERRL. EOMI. Head: No acute traumatic findings ENT:      Nose: No congestion/rhinnorhea.      Mouth/Throat: Mucous membranes are moist. Neck: No stridor. No cervical spine tenderness to palpation. Cardiovascular:  Good peripheral perfusion Respiratory: Normal respiratory effort without tachypnea or retractions. Lungs CTAB. Good air entry to the bases with no decreased or absent breath sounds. Gastrointestinal: Bowel sounds 4 quadrants. Soft and nontender to palpation. No guarding or rigidity. No palpable masses. No distention. No CVA tenderness. Musculoskeletal: Patient's right forearm is edematous with no overlying erythema.  Palpable radial and ulnar pulses.  Capillary refill less than 2  seconds on the right. Neurologic:  No gross focal neurologic deficits are appreciated.  Skin:   No rash noted Other:   ED Results / Procedures / Treatments   Labs (all labs ordered are listed, but only abnormal results are displayed) Labs Reviewed - No data to display     PROCEDURES:  Critical Care performed: No  Procedures   MEDICATIONS ORDERED IN ED: Medications  ketorolac (TORADOL) injection 30 mg (has no administration in time range)     IMPRESSION / MDM / ASSESSMENT AND PLAN / ED COURSE  I reviewed the triage vital signs and the nursing notes.                              Assessment and plan Arm swelling 31 year old male presents to the urgent care with new onset right forearm swelling after recent new workouts.  Vital signs are reassuring at triage.  On exam, patient was alert, active and nontoxic-appearing.  His neuro exam was reassuring and patient had symmetric strength.  No overlying erythema to suggest cellulitis.  Patient has few risk factors for upper extremity DVT and suspicion for DVT is low.  We will give  patient injection of Toradol and encourage hydration at home.  Patient was cautioned that should his upper extremity swelling worsen over the next 12 hours to seek care at the emergency department at [facility].  He voiced understanding and has easy access to the ED.   FINAL CLINICAL IMPRESSION(S) / ED DIAGNOSES   Final diagnoses:  Arm swelling     Rx / DC Orders   ED Discharge Orders     None        Note:  This document was prepared using Dragon voice recognition software and may include unintentional dictation errors.   Pia Mau [place], [place] 11/13/21 [phone]

## 2021-11-13 NOTE — ED Triage Notes (Signed)
Pt presents with right forearm swelling and pain since today.

## 2021-11-13 NOTE — Discharge Instructions (Signed)
Keep arm elevated tonight and apply ice. If swelling worsens over the next 12 hours, please go to the emergency department at [facility]. You can take 500 mg of Aleve tomorrow morning.

## 2021-11-14 ENCOUNTER — Emergency Department
Admission: EM | Admit: 2021-11-14 | Discharge: 2021-11-14 | Disposition: A | Discharge status: Home / Self Care | Payer: 59 | Attending: Emergency Medicine | Admitting: Emergency Medicine

## 2021-11-14 ENCOUNTER — Other Ambulatory Visit: Payer: Self-pay

## 2021-11-14 ENCOUNTER — Emergency Department: Payer: 59

## 2021-11-14 DIAGNOSIS — R748 Abnormal levels of other serum enzymes: Secondary | ICD-10-CM | POA: Insufficient documentation

## 2021-11-14 DIAGNOSIS — M79631 Pain in right forearm: Secondary | ICD-10-CM | POA: Insufficient documentation

## 2021-11-14 DIAGNOSIS — M7989 Other specified soft tissue disorders: Secondary | ICD-10-CM | POA: Insufficient documentation

## 2021-11-14 DIAGNOSIS — R7401 Elevation of levels of liver transaminase levels: Secondary | ICD-10-CM | POA: Insufficient documentation

## 2021-11-14 LAB — COMPREHENSIVE METABOLIC PANEL
ALT: 88 U/L — ABNORMAL HIGH (ref 0–44)
AST: 76 U/L — ABNORMAL HIGH (ref 15–41)
Albumin: 3.7 g/dL (ref 3.5–5.0)
Alkaline Phosphatase: 46 U/L (ref 38–126)
Anion gap: 4 — ABNORMAL LOW (ref 5–15)
BUN: 13 mg/dL (ref 6–20)
CO2: 27 mmol/L (ref 22–32)
Calcium: 9 mg/dL (ref 8.9–10.3)
Chloride: 109 mmol/L (ref 98–111)
Creatinine, Ser: 1.08 mg/dL (ref 0.61–1.24)
GFR, Estimated: >60 mL/min (ref >60)
Glucose, Bld: 103 mg/dL — ABNORMAL HIGH (ref 70–99)
Potassium: 4 mmol/L (ref 3.5–5.1)
Sodium: 140 mmol/L (ref 135–145)
Total Bilirubin: 0.7 mg/dL (ref 0.3–1.2)
Total Protein: 6.3 g/dL — ABNORMAL LOW (ref 6.5–8.1)

## 2021-11-14 LAB — CBC WITH DIFFERENTIAL/PLATELET
Abs Immature Granulocytes: 0.01 10*3/uL (ref 0.00–0.07)
Basophils Absolute: 0 10*3/uL (ref 0.0–0.1)
Basophils Relative: 1 %
Eosinophils Absolute: 0.4 10*3/uL (ref 0.0–0.5)
Eosinophils Relative: 6 %
HCT: 39.8 % (ref 39.0–52.0)
Hemoglobin: 13.4 g/dL (ref 13.0–17.0)
Immature Granulocytes: 0 %
Lymphocytes Relative: 37 %
Lymphs Abs: 2.4 10*3/uL (ref 0.7–4.0)
MCH: 31.2 pg (ref 26.0–34.0)
MCHC: 33.7 g/dL (ref 30.0–36.0)
MCV: 92.8 fL (ref 80.0–100.0)
Monocytes Absolute: 0.4 10*3/uL (ref 0.1–1.0)
Monocytes Relative: 6 %
Neutro Abs: 3.2 10*3/uL (ref 1.7–7.7)
Neutrophils Relative %: 50 %
Platelets: 220 10*3/uL (ref 150–400)
RBC: 4.29 MIL/uL (ref 4.22–5.81)
RDW: 12.7 % (ref 11.5–15.5)
WBC: 6.5 10*3/uL (ref 4.0–10.5)
nRBC: 0 % (ref 0.0–0.2)

## 2021-11-14 LAB — CK: Total CK: 1503 U/L — ABNORMAL HIGH (ref 49–397)

## 2021-11-14 NOTE — ED Notes (Signed)
See triage note  presents with swelling to right arm  states he noticed swelling yesterday  thinks swelling as gotten worse  unsure of injury  but states he has been  working out  good pulses

## 2021-11-14 NOTE — Discharge Instructions (Signed)
Apply ice and keep arm elevated at home. If symptoms do not start improving over the next 1 to 2 weeks, please follow-up with orthopedics. Please reduce workouts until symptoms start to improve.

## 2021-11-14 NOTE — ED Provider Notes (Signed)
[facility] Provider Note  Patient Contact: 4:12 PM (approximate)   History   Arm Swelling   HPI  Craig Davis. is a 31 y.o. male presents to the emergency department with worsening forearm pain and swelling.  Patient was seen and evaluated at urgent care yesterday and had swelling mostly localized to forearm.  Patient states that forearm swelling has extended to his upper arm today.  Patient has recent history of new exercise regimen but denies other falls or mechanisms of trauma.  He denies fever and chills and has not noticed any erythema overlying the skin of the right upper extremity.  Denies history of rhabdo.  Patient vapes daily but denies prolonged immobilization, recent surgery or recent travel.  No prior DVT or PE history.      Physical Exam   Triage Vital Signs: ED Triage Vitals  Enc Vitals Group     BP 11/14/21 1559 107/69     Pulse Rate 11/14/21 1559 70     Resp 11/14/21 1559 18     Temp 11/14/21 1559 98.4 F (36.9 C)     Temp Source 11/14/21 1559 Oral     SpO2 11/14/21 1559 95 %     Weight 11/14/21 1607 289 lb 14.5 oz (131.5 kg)     Height 11/14/21 1556 6\' 3"  (1.905 m)     Head Circumference --      Peak Flow --      Pain Score 11/14/21 1556 0     Pain Loc --      Pain Edu? --      Excl. in [place]? --     Most recent vital signs: Vitals:   11/14/21 1559 11/14/21 1929  BP: 107/69 114/69  Pulse: 70 (!) 57  Resp: 18 15  Temp: 98.4 F (36.9 C)   SpO2: 95% 98%     General: Alert and in no acute distress. Eyes:  PERRL. EOMI. Head: No acute traumatic findings ENT:      Nose: No congestion/rhinnorhea.      Mouth/Throat: Mucous membranes are moist. Neck: No stridor. No cervical spine tenderness to palpation. Cardiovascular:  Good peripheral perfusion Respiratory: Normal respiratory effort without tachypnea or retractions. Lungs CTAB. Good air entry to the bases with no decreased or absent breath  sounds. Gastrointestinal: Bowel sounds 4 quadrants. Soft and nontender to palpation. No guarding or rigidity. No palpable masses. No distention. No CVA tenderness. Musculoskeletal: Patient has symmetric strength in the upper extremities and performs full range of motion of the right shoulder, right elbow and right wrist without perceived pain.  Right forearm appears edematous without overlying erythema or palpable induration.  Palpable radial and ulnar pulses bilaterally and symmetrically.  Capillary refill less than 2 seconds on the right. Neurologic:  No gross focal neurologic deficits are appreciated.  Skin:   No rash noted Other:   ED Results / Procedures / Treatments   Labs (all labs ordered are listed, but only abnormal results are displayed) Labs Reviewed  CK - Abnormal; Notable for the following components:      Result Value   Total CK 1,503 (*)    All other components within normal limits  COMPREHENSIVE METABOLIC PANEL - Abnormal; Notable for the following components:   Glucose, Bld 103 (*)    Total Protein 6.3 (*)    AST 76 (*)    ALT 88 (*)    Anion gap 4 (*)    All other components within normal  limits  CBC WITH DIFFERENTIAL/PLATELET       RADIOLOGY  I personally viewed and evaluated these images as part of my medical decision making, as well as reviewing the written report by the radiologist.  ED Provider Interpretation: I personally interpreted venous ultrasound there was no evidence of upper extremity DVT on the right.   PROCEDURES:  Critical Care performed: No  Procedures   MEDICATIONS ORDERED IN ED: Medications - No data to display   IMPRESSION / MDM / ASSESSMENT AND PLAN / ED COURSE  I reviewed the triage vital signs and the nursing notes.                              Assessment and plan Right forearm swelling:  Differential diagnosis includes sprain/strain, upper extremity DVT, rhabdo...  31 year old male presents to the emergency  department with right forearm swelling.  CBC reassuring.  Patient had mild elevation of AST and ALT which appears to be baseline for him.  Patient had mild elevation of CK but not consistent with rhabdo.  Upper extremity ultrasound shows no evidence of DVT.  Suspect overuse injury at this time.  Will recommend anti-inflammatories, ice and elevation at home with follow-up with orthopedics if symptoms not seem to be improving.     FINAL CLINICAL IMPRESSION(S) / ED DIAGNOSES   Final diagnoses:  Arm swelling     Rx / DC Orders   ED Discharge Orders     None        Note:  This document was prepared using Dragon voice recognition software and may include unintentional dictation errors.   Pia Mau [place], Cordelia Poche 11/14/21 2014    Jene Every, MD 11/15/21 [phone]

## 2021-11-14 NOTE — ED Triage Notes (Addendum)
Patient to ER via POV with complaints of right forearm swelling that started yesterday morning, believes it may have worsened over night. Patient reports smoking pot/ vaping, no recent illness/ travel. Denies SHOB/ CP.   Patient does report cutting his left thumb on a pair of pliers the day prior.

## 2022-07-14 ENCOUNTER — Other Ambulatory Visit: Payer: Self-pay

## 2022-07-14 ENCOUNTER — Encounter: Payer: Self-pay | Admitting: *Deleted

## 2022-07-14 ENCOUNTER — Emergency Department: Payer: 59

## 2022-07-14 DIAGNOSIS — M79672 Pain in left foot: Secondary | ICD-10-CM | POA: Insufficient documentation

## 2022-07-14 NOTE — ED Triage Notes (Signed)
Pt has left foot pain for 2 months with burning sensation.  No known injury to foot.  Pt alert  speech clear.

## 2022-07-15 ENCOUNTER — Emergency Department
Admission: EM | Admit: 2022-07-15 | Discharge: 2022-07-15 | Disposition: A | Discharge status: Home / Self Care | Payer: 59 | Attending: Emergency Medicine | Admitting: Emergency Medicine

## 2022-07-15 DIAGNOSIS — M79672 Pain in left foot: Secondary | ICD-10-CM

## 2022-07-15 MED ORDER — MELOXICAM 15 MG PO TABS: 15.0000 mg | ORAL_TABLET | Freq: Every day | ORAL | 0 refills | Status: AC

## 2022-07-15 MED ORDER — NAPROXEN 500 MG PO TABS
500.0000 mg | ORAL_TABLET | Freq: Once | ORAL | Status: AC
  Administered 2022-07-15: 500 mg via ORAL
  Filled 2022-07-15: qty 1

## 2022-07-15 NOTE — ED Provider Notes (Signed)
[facility] Provider Note    Event Date/Time   First MD Initiated Contact with Patient 07/15/22 0220     (approximate)   History   Foot Pain   HPI  Craig Tylicki Trevian Nickol. is a 32 y.o. male with history of seizures, gout who presents to the emergency department with left foot pain ongoing for the past month.  States it is worse with walking and mostly over the dorsal foot but will radiate up into the lower shin.  No calf tenderness or calf swelling.  Has an appointment to see podiatry tomorrow.  No fever.  Able to ambulate.   History provided by patient.    Past Medical History:  Diagnosis Date   Gout    Seizures ([place])     Past Surgical History:  Procedure Laterality Date   TONSILLECTOMY     VASECTOMY      MEDICATIONS:  Prior to Admission medications   Medication Sig Start Date End Date Taking? Authorizing Provider  allopurinol (ZYLOPRIM) 100 MG tablet Take 1 tablet by mouth daily. 09/20/20   [provider]  diclofenac Sodium (VOLTAREN) 1 % GEL Apply 1 application topically 4 (four) times daily. Patient not taking: Reported on 05/28/2020 12/29/19   [provider]  levETIRAcetam (KEPPRA) 500 MG tablet Take 500 mg by mouth 2 (two) times daily. 11/02/21   [provider]  meloxicam (MOBIC) 15 MG tablet TAKE ONE TABLET BY MOUTH ONCE DAILY 09/23/21   Criselda Peaches, DPM    Physical Exam   Triage Vital Signs: ED Triage Vitals  Enc Vitals Group     BP 07/14/22 2207 (!) 139/96     Pulse Rate 07/14/22 2207 70     Resp 07/14/22 2207 18     Temp 07/14/22 2207 98.5 F (36.9 C)     Temp Source 07/14/22 2207 Oral     SpO2 07/14/22 2207 98 %     Weight 07/14/22 2204 245 lb (111.1 kg)     Height 07/14/22 2204 6' 3"$  (1.905 m)     Head Circumference --      Peak Flow --      Pain Score 07/14/22 2204 7     Pain Loc --      Pain Edu? --      Excl. in [place]? --     Most recent vital signs: Vitals:   07/14/22 2207  BP:  (!) 139/96  Pulse: 70  Resp: 18  Temp: 98.5 F (36.9 C)  SpO2: 98%     CONSTITUTIONAL: Alert and responds appropriately to questions. Well-appearing; well-nourished HEAD: Normocephalic, atraumatic EYES: Conjunctivae clear, pupils appear equal ENT: normal nose; moist mucous membranes NECK: Normal range of motion CARD: Regular rate and rhythm RESP: Normal chest excursion without splinting or tachypnea; no hypoxia or respiratory distress, speaking full sentences ABD/GI: non-distended EXT: Normal ROM in all joints, no major deformities noted.  Normal cap refill on the left foot.  Tender over the left dorsal foot without soft tissue swelling, redness or warmth.  2+ left DP pulse.  No calf tenderness or calf swelling.  Compartments of the left leg are soft.  No redness or increased warmth.  No edema.  No joint effusion. SKIN: Normal color for age and race, no rashes on exposed skin NEURO: Moves all extremities equally, normal speech, no facial asymmetry noted PSYCH: The patient's mood and manner are appropriate. Grooming and personal hygiene are appropriate.  ED Results / Procedures /  Treatments   LABS: (all labs ordered are listed, but only abnormal results are displayed) Labs Reviewed - No data to display   EKG:   RADIOLOGY: My personal review and interpretation of imaging: X-ray shows no acute abnormality.  I have personally reviewed all radiology reports. DG Foot Complete Left  Result Date: 07/14/2022 CLINICAL DATA:  Pain EXAM: LEFT FOOT - COMPLETE 3+ VIEW COMPARISON:  Radiographs dated May 07, 2020 FINDINGS: There is no evidence of fracture or dislocation. There is joint space loss at the tibiotalar joint with marginal osteophytes. There is heterotopic ossification about the os trigonum/posterior aspect of the talus, also concerning for ongoing arthritic process. There are degenerative changes of the talonavicular and talonavicular joints. Soft tissue swelling about the ankle  joint. These findings are mildly progressed from prior left foot radiograph of May 07, 2020, suggesting chronic arthritis. IMPRESSION: 1. No evidence of fracture or dislocation. 2. Degenerative changes of the tibiotalar joint, talonavicular joint, and os trigonum, mildly progressed from prior left foot radiograph of May 07, 2020. Findings most consistent with arthropathy, which may be posttraumatic or chronic inflammatory process, correlate with clinical history. Electronically Signed   By: Keane Police D.O.   On: 07/14/2022 23:02     PROCEDURES:  Critical Care performed: No     Procedures    IMPRESSION / MDM / ASSESSMENT AND PLAN / ED COURSE  I reviewed the triage vital signs and the nursing notes.   Patient here with left foot pain ongoing for 1 to 2 months.     DIFFERENTIAL DIAGNOSIS (includes but not limited to):   Tendinitis, plantar fasciitis, doubt DVT, arterial obstruction, compartment syndrome, cellulitis, gout, fracture  Patient's presentation is most consistent with acute, uncomplicated illness.  PLAN: X-ray obtained of the foot from triage and reviewed/interpreted by myself and the radiologist.  No acute abnormality but does show chronic degenerative changes which could be from his previous episodes of gout.  He denies any other traumatic injury.  He is seeing podiatry tomorrow.  No sign of any emergency present today.  Will discharge on Mobic and wrapped his foot for compression and comfort.  He is ambulatory.  Will provide with work note for couple of days.  Recommended rest, elevation and ice.  No further emergent workup indicated at this time.  Offered Toradol here but patient declines.  Will give him naproxen.    Patient is neurovascularly intact distally without signs of infection, gout, arterial obstruction, DVT compartment syndrome.   MEDICATIONS GIVEN IN ED: Medications  naproxen (NAPROSYN) tablet 500 mg (has no administration in time range)     ED  COURSE:  At this time, I do not feel there is any life-threatening condition present. I reviewed all nursing notes, vitals, pertinent previous records.  All lab and urine results, EKGs, imaging ordered have been independently reviewed and interpreted by myself.  I reviewed all available radiology reports from any imaging ordered this visit.  Based on my assessment, I feel the patient is safe to be discharged home without further emergent workup and can continue workup as an outpatient as needed. Discussed all findings, treatment plan as well as usual and customary return precautions.  They verbalize understanding and are comfortable with this plan.  Outpatient follow-up has been provided as needed.  All questions have been answered.    CONSULTS:  none   OUTSIDE RECORDS REVIEWED: Reviewed last neurology note on 05/01/2022.     FINAL CLINICAL IMPRESSION(S) / ED DIAGNOSES   Final  diagnoses:  Foot pain, left     Rx / DC Orders   ED Discharge Orders          Ordered    meloxicam (MOBIC) 15 MG tablet  Daily        07/15/22 0238             Note:  This document was prepared using Dragon voice recognition software and may include unintentional dictation errors.   Anginette Espejo, Delice Bison, DO 07/15/22 [phone]

## 2022-07-15 NOTE — Discharge Instructions (Addendum)
Please follow-up podiatry as scheduled on Wednesday.  I recommend keeping your foot wrapped for compression, elevated and apply ice as needed.  Please take Mobic daily with food.

## 2022-07-16 ENCOUNTER — Ambulatory Visit (INDEPENDENT_AMBULATORY_CARE_PROVIDER_SITE_OTHER): Payer: 59 | Admitting: Podiatry

## 2022-07-16 ENCOUNTER — Telehealth: Payer: Self-pay

## 2022-07-16 DIAGNOSIS — M21962 Unspecified acquired deformity of left lower leg: Secondary | ICD-10-CM

## 2022-07-16 DIAGNOSIS — M21961 Unspecified acquired deformity of right lower leg: Secondary | ICD-10-CM | POA: Diagnosis not present

## 2022-07-16 DIAGNOSIS — M7752 Other enthesopathy of left foot: Secondary | ICD-10-CM

## 2022-07-16 DIAGNOSIS — Q667 Congenital pes cavus, unspecified foot: Secondary | ICD-10-CM

## 2022-07-16 MED ORDER — METHYLPREDNISOLONE 4 MG PO TBPK: ORAL_TABLET | ORAL | 0 refills | Status: DC

## 2022-07-16 MED ORDER — ALLOPURINOL 100 MG PO TABS: 100.0000 mg | ORAL_TABLET | Freq: Every day | ORAL | 6 refills | Status: DC

## 2022-07-16 NOTE — Progress Notes (Signed)
  Subjective:  Patient ID: Craig Davis., male    DOB: [birth year],  MRN: [id number]  Chief Complaint  Patient presents with   Foot Pain    Left foot pain mostly on the top of foot     32 y.o. male presents with the above complaint.  Patient presents with left first metatarsophalangeal joint pain.  Patient states it started a month and a half ago has progressive gotten worse he works at [employer].  He has not seen anyone as prior to seeing me for this.  He went to the emergency room where they just diagnosed with arthritis.  He wanted to discuss this further he has history of gout.  He does not wear any orthotics.   Review of Systems: Negative except as noted in the HPI. Denies N/V/F/Ch.  Past Medical History:  Diagnosis Date   Gout    Seizures ([place])     Current Outpatient Medications:    methylPREDNISolone (MEDROL DOSEPAK) 4 MG TBPK tablet, Take as directed, Disp: 21 each, Rfl: 0   allopurinol (ZYLOPRIM) 100 MG tablet, Take 1 tablet by mouth daily., Disp: , Rfl:    levETIRAcetam (KEPPRA) 500 MG tablet, Take 500 mg by mouth 2 (two) times daily., Disp: , Rfl:    meloxicam (MOBIC) 15 MG tablet, Take 1 tablet (15 mg total) by mouth daily., Disp: 30 tablet, Rfl: 0  Social History   Tobacco Use  Smoking Status Former   Types: Cigarettes  Smokeless Tobacco Never  Tobacco Comments   2 years when younger    No Known Allergies Objective:  There were no vitals filed for this visit. There is no height or weight on file to calculate BMI. Constitutional Well developed. Well nourished.  Vascular Dorsalis pedis pulses palpable bilaterally. Posterior tibial pulses palpable bilaterally. Capillary refill normal to all digits.  No cyanosis or clubbing noted. Pedal hair growth normal.  Neurologic Normal speech. Oriented to person, place, and time. Epicritic sensation to light touch grossly present bilaterally.  Dermatologic Nails well groomed and normal in appearance. No open  wounds. No skin lesions.  Orthopedic: Pain on palpation left first metatarsophalangeal joint pain with range of motion of the joint.  No deep intra-articular pain noted.  No crepitus clinically appreciated.  Pes cavus foot structure noted.  Supinated foot type from the hindfoot noted.  Likely putting excessive stress on the first metatarsophalangeal joint   Radiographs: IMPRESSION: 1. No evidence of fracture or dislocation. 2. Degenerative changes of the tibiotalar joint, talonavicular joint, and os trigonum, mildly progressed from prior left foot radiograph of May 07, 2020. Findings most consistent with arthropathy, which may be posttraumatic or chronic inflammatory process, correlate with clinical history.   Assessment:   1. Capsulitis of metatarsophalangeal (MTP) joint of left foot   2. Pes cavus   3. Deformity of both feet    Plan:  Patient was evaluated and treated and all questions answered.  Left first MTP capsulitis with underlying pes cavus deformity/foot deformity -All questions and concerns were discussed with the patient in extensive detail given the amount of pain that he is having he will benefit from cam boot immobilization to help decrease inflammatory component associated pain.  Once the cam boot has decreased his pain will benefit from steroid injection during next medical visit.   -He was also  casted for orthotics to control the supinated foot  No follow-ups on file.

## 2022-07-16 NOTE — Telephone Encounter (Signed)
Rx has been refilled and no further information is needed

## 2022-07-28 ENCOUNTER — Telehealth: Payer: Self-pay | Admitting: Podiatry

## 2022-07-28 ENCOUNTER — Encounter: Payer: Self-pay | Admitting: Podiatry

## 2022-07-28 NOTE — Telephone Encounter (Signed)
Patient left a voicemail and said that his foot is not getting any better because he walks all day at work at [employer]. He would like to be taken out of work to see if his feet would heel that way. He said you put him a boot for a month but he is unable to wear it at work.

## 2022-08-19 ENCOUNTER — Ambulatory Visit: Payer: 59 | Admitting: Podiatry

## 2022-08-19 DIAGNOSIS — M7752 Other enthesopathy of left foot: Secondary | ICD-10-CM | POA: Diagnosis not present

## 2022-08-19 NOTE — Progress Notes (Signed)
  Subjective:  Patient ID: Craig Mins., male    DOB: Mar 15, 1991,  MRN: [id number]  Chief Complaint  Patient presents with   Foot Pain    32 y.o. male presents with the above complaint.  Patient presents for follow-up of left first metatarsophalangeal joint.  Patient states is doing a lot better.  Pain scale has improved he denies any other acute complaints.   Review of Systems: Negative except as noted in the HPI. Denies N/V/F/Ch.  Past Medical History:  Diagnosis Date   Gout    Seizures ([place])     Current Outpatient Medications:    allopurinol (ZYLOPRIM) 100 MG tablet, Take 1 tablet by mouth daily., Disp: , Rfl:    allopurinol (ZYLOPRIM) 100 MG tablet, Take 1 tablet (100 mg total) by mouth daily., Disp: 30 tablet, Rfl: 6   allopurinol (ZYLOPRIM) 100 MG tablet, Take 1 tablet (100 mg total) by mouth daily., Disp: 30 tablet, Rfl: 6   levETIRAcetam (KEPPRA) 500 MG tablet, Take 500 mg by mouth 2 (two) times daily., Disp: , Rfl:    methylPREDNISolone (MEDROL DOSEPAK) 4 MG TBPK tablet, Take as directed, Disp: 21 each, Rfl: 0  Social History   Tobacco Use  Smoking Status Former   Types: Cigarettes  Smokeless Tobacco Never  Tobacco Comments   2 years when younger    No Known Allergies Objective:  There were no vitals filed for this visit. There is no height or weight on file to calculate BMI. Constitutional Well developed. Well nourished.  Vascular Dorsalis pedis pulses palpable bilaterally. Posterior tibial pulses palpable bilaterally. Capillary refill normal to all digits.  No cyanosis or clubbing noted. Pedal hair growth normal.  Neurologic Normal speech. Oriented to person, place, and time. Epicritic sensation to light touch grossly present bilaterally.  Dermatologic Nails well groomed and normal in appearance. No open wounds. No skin lesions.  Orthopedic: No further pain on palpation left first metatarsophalangeal joint no further pain with range of motion  of the joint.  No deep intra-articular pain noted.  No crepitus clinically appreciated.  Pes cavus foot structure noted.  Supinated foot type from the hindfoot noted.  Likely putting excessive stress on the first metatarsophalangeal joint   Radiographs: IMPRESSION: 1. No evidence of fracture or dislocation. 2. Degenerative changes of the tibiotalar joint, talonavicular joint, and os trigonum, mildly progressed from prior left foot radiograph of May 07, 2020. Findings most consistent with arthropathy, which may be posttraumatic or chronic inflammatory process, correlate with clinical history.   Assessment:   No diagnosis found.  Plan:  Patient was evaluated and treated and all questions answered.  Left first MTP capsulitis with underlying pes cavus deformity/foot deformity -All questions and concerns were discussed with the patient in extensive detail -clinically his pain is improved considerably.  He will transition out of the cam boot into regular shoes.  I will hold on any further injection. -I discussed orthotics management and shoe gear modification he states understanding.  No follow-ups on file.    Left first MTP capsulitis improved transition out of the cam boot into regular shoes -Discussed continue orthotics management shoe gear modification

## 2022-08-22 ENCOUNTER — Ambulatory Visit: Payer: 59 | Admitting: Podiatry

## 2022-08-25 ENCOUNTER — Encounter: Payer: Self-pay | Admitting: Podiatry

## 2022-08-25 ENCOUNTER — Telehealth: Payer: Self-pay | Admitting: Podiatry

## 2022-08-25 NOTE — Telephone Encounter (Signed)
Mr. Craig Davis wanted to confirm, how much longer you wanted him to stay out of work? I didn't see your notes in the system for his 3/19/ office visit

## 2022-08-26 ENCOUNTER — Encounter: Payer: Self-pay | Admitting: Podiatry

## 2022-09-04 ENCOUNTER — Encounter: Payer: Self-pay | Admitting: Podiatry

## 2022-09-04 ENCOUNTER — Ambulatory Visit (INDEPENDENT_AMBULATORY_CARE_PROVIDER_SITE_OTHER): Payer: 59 | Admitting: *Deleted

## 2022-09-04 DIAGNOSIS — Q667 Congenital pes cavus, unspecified foot: Secondary | ICD-10-CM

## 2022-09-04 DIAGNOSIS — M7752 Other enthesopathy of left foot: Secondary | ICD-10-CM

## 2022-09-04 NOTE — Progress Notes (Signed)
Patient presents today to pick up custom molded foot orthotics recommended by Dr. PATEL.   Orthotics were dispensed and fit was satisfactory. Reviewed instructions for break-in and wear. Written instructions given to patient.  Patient will follow up as needed.   

## 2022-09-30 ENCOUNTER — Ambulatory Visit: Payer: 59 | Admitting: Podiatry

## 2022-10-30 IMAGING — MR MR HEAD W/O CM
11 of 15 series · 32 of 48 positions shown · non-contrast
Comparison: Head CT 04/15/2020

CLINICAL DATA: Seizure.

EXAM:
MRI HEAD WITHOUT CONTRAST
TECHNIQUE: Multiplanar, multiecho pulse sequences of the brain and surrounding
structures were obtained without intravenous contrast.

[Series 2: DWI · axial · 3.0mm · 0.94mm/px · z∈[-93,+70]mm · 7 of 112 slices shown (1 of 4)]
[im 1/112]
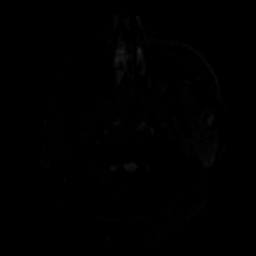
[im 19/112]
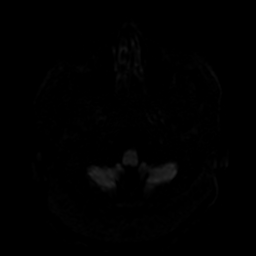
[im 38/112]
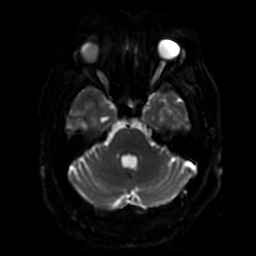
[im 56/112]
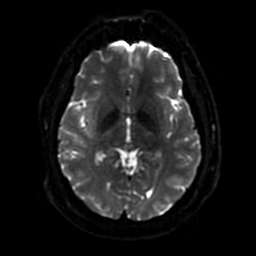
[im 75/112]
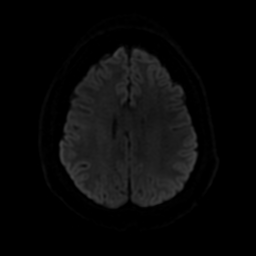
[im 93/112]
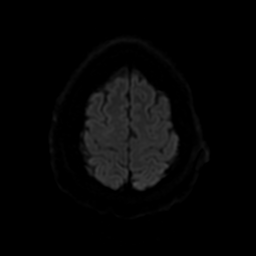
[im 112/112]
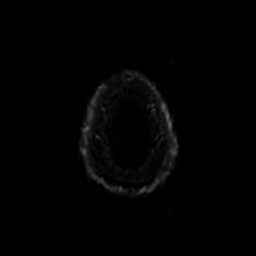

[Series 3: DWI · coronal · 4.0mm · 0.94mm/px · 4 of 74 slices shown (2 of 4)]
[im 1/74]
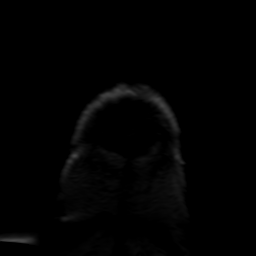
[im 25/74]
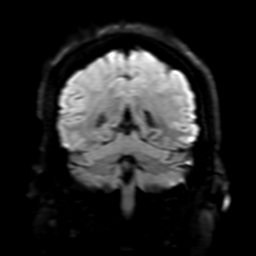
[im 49/74]
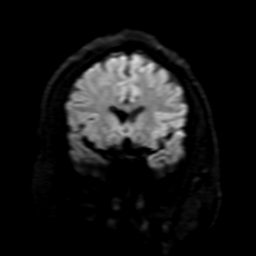
[im 74/74]
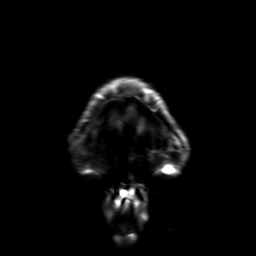

[Series 4: FLAIR · sagittal · 5.0mm · 0.23mm/px · 1 of 26 slices shown (1 of 3)]
[im 1/26]
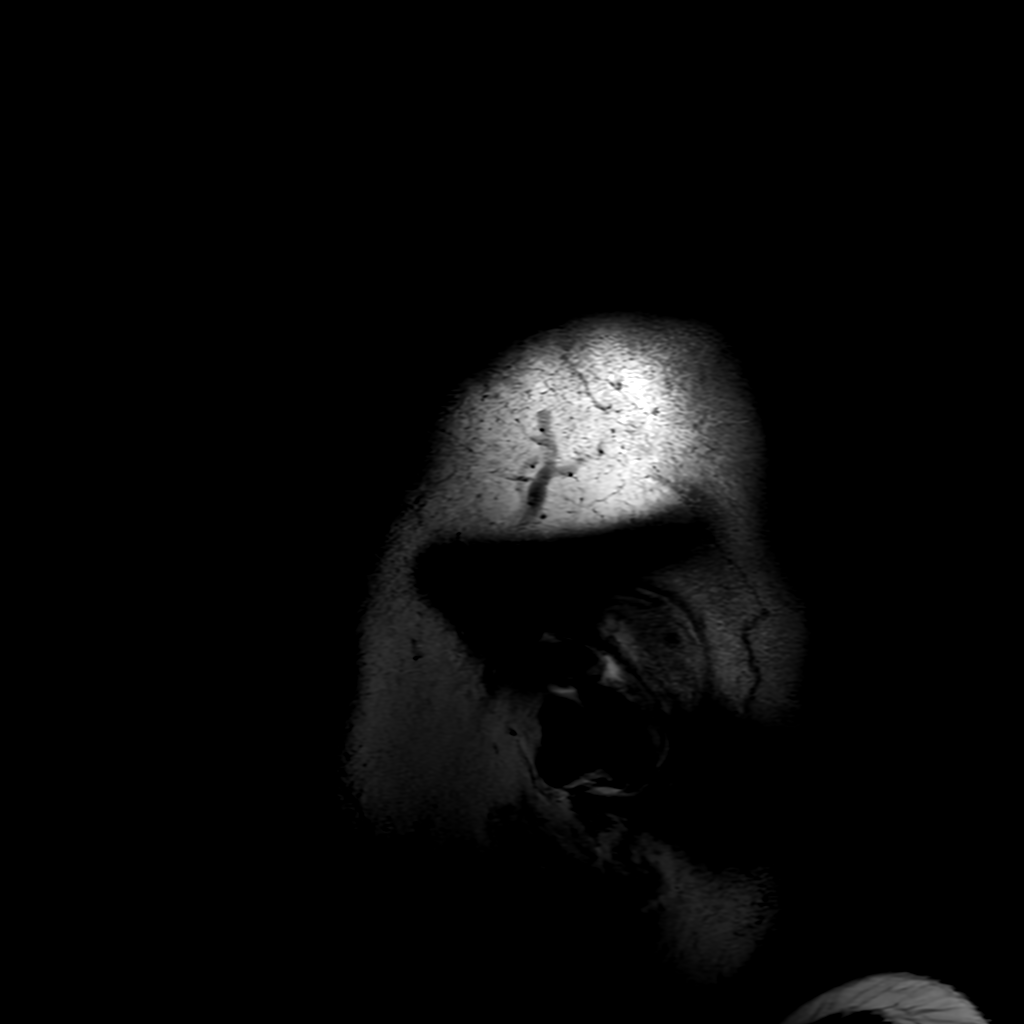

[Series 5: T2 · axial · 5.0mm · 0.23mm/px · 1 of 28 slices shown (1 of 2)]
[im 1/28]
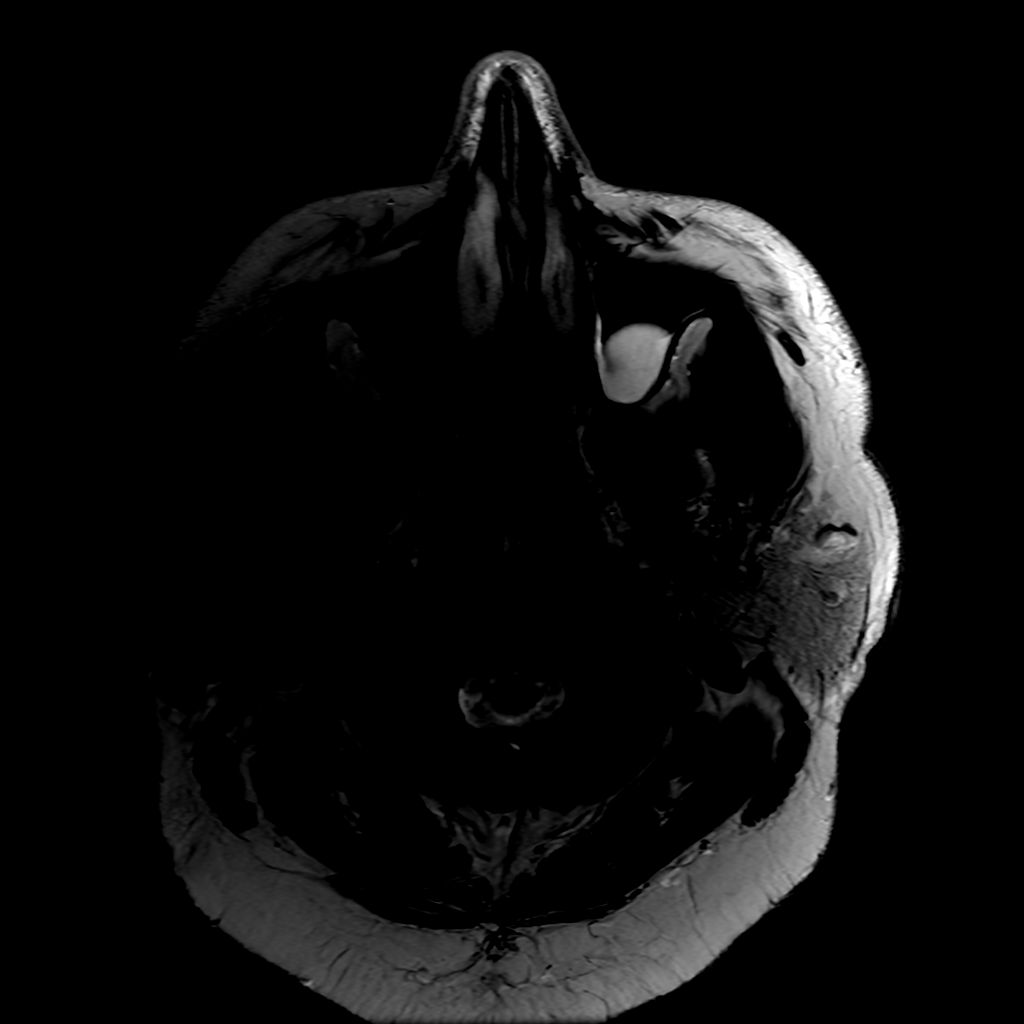

[Series 6: FLAIR · axial · 3.0mm · 0.45mm/px · 1 of 28 slices shown (2 of 3)]
[im 1/28]
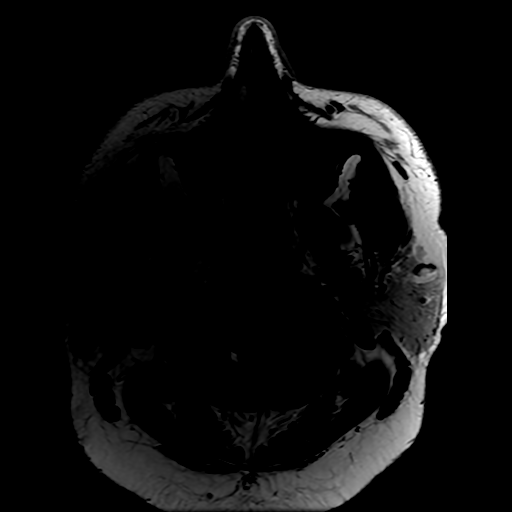

[Series 9: FLAIR · coronal · 3.0mm · 0.35mm/px · 1 of 27 slices shown (3 of 3)]
[im 1/27]
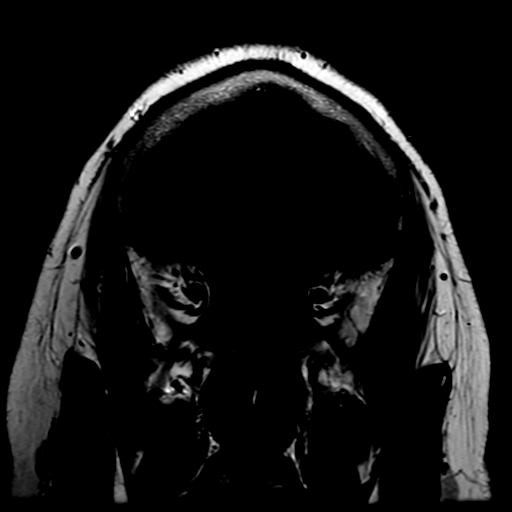

[Series 11: T2 · coronal · 5.0mm · 0.47mm/px · 2 of 31 slices shown (2 of 2)]
[im 1/31]
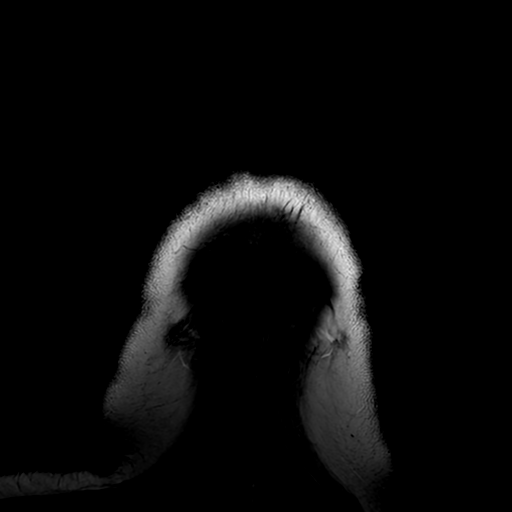
[im 31/31]
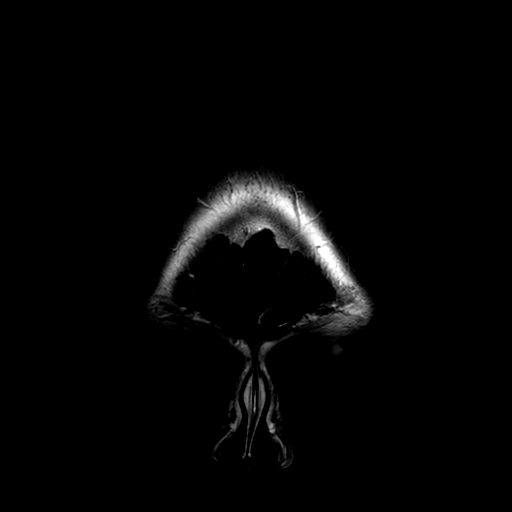

[Series 210: DWI · axial · 3.0mm · 0.94mm/px · z∈[-93,+70]mm · 6 of 112 slices shown (3 of 4)]
[im 1/112]
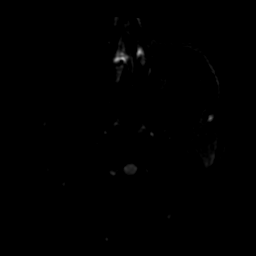
[im 23/112]
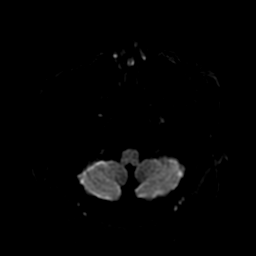
[im 45/112]
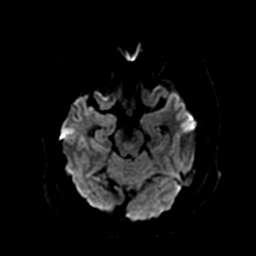
[im 67/112]
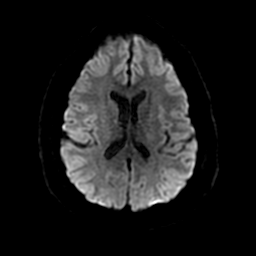
[im 89/112]
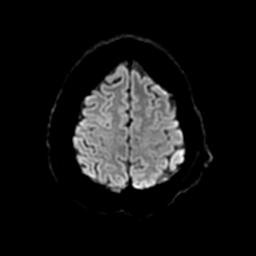
[im 112/112]
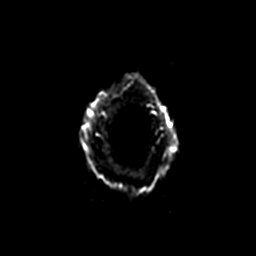

[Series 250: ADC · axial · 3.0mm · 0.94mm/px · z∈[-93,+70]mm · 3 of 56 slices shown (1 of 2)]
[im 1/56]
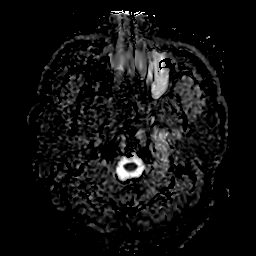
[im 28/56]
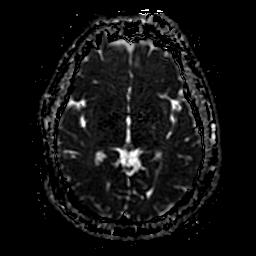
[im 56/56]
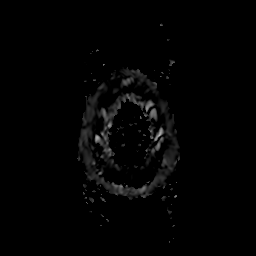

[Series 310: DWI · coronal · 4.0mm · 0.94mm/px · 4 of 74 slices shown (4 of 4)]
[im 1/74]
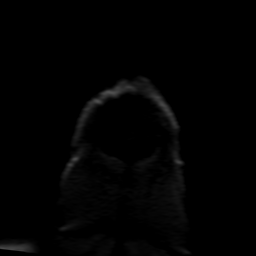
[im 25/74]
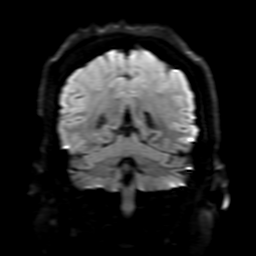
[im 49/74]
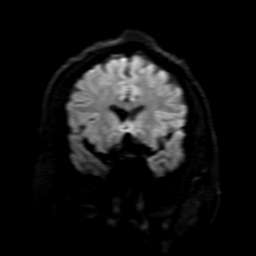
[im 74/74]
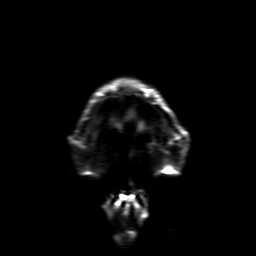

[Series 350: ADC · coronal · 4.0mm · 0.94mm/px · 2 of 37 slices shown (2 of 2)]
[im 1/37]
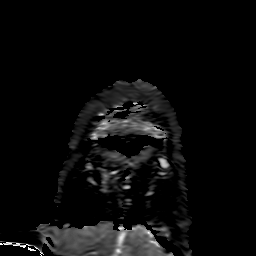
[im 37/37]
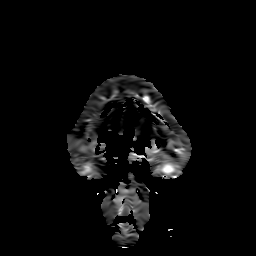

[32 of 48 positions shown; findings below may reference images not displayed]

FINDINGS: Brain: Dedicated thin section imaging through the temporal lobes
demonstrates normal volume and signal of the hippocampi. There is no
evidence of heterotopia or cortical dysplasia.

There is no evidence of an acute infarct, intracranial hemorrhage,
mass, midline shift, or extra-axial fluid collection. The ventricles
and sulci are normal. The brain is normal in signal. There is a
mildly expanded partially empty sella.

Vascular: Major intracranial vascular flow voids are preserved.

Skull and upper cervical spine: Unremarkable bone marrow signal.

Sinuses/Orbits: Unremarkable orbits. Small bilateral maxillary sinus
mucous retention cyst. Mild bilateral ethmoid sinus mucosal
thickening. Clear mastoid air cells.

Other: None.
IMPRESSION: 1. Partially empty sella, often an incidental finding though can be
seen with idiopathic intracranial hypertension.
2. Otherwise unremarkable appearance of the brain. No etiology of
seizures identified.

## 2022-10-30 IMAGING — CT CT HEAD W/O CM
4 series · 17 of 47 positions shown, 19 images · non-contrast
Comparison: None.

CLINICAL DATA: Seizure

EXAM:
CT HEAD WITHOUT CONTRAST
TECHNIQUE: Contiguous axial images were obtained from the base of the skull
through the vertex without intravenous contrast.

[Series 3: head without · axial · non-contrast · 0.48mm/px · z∈[-111,+29]mm · 7 of 38 slices shown, 9 images]
[im 5/38  brain]
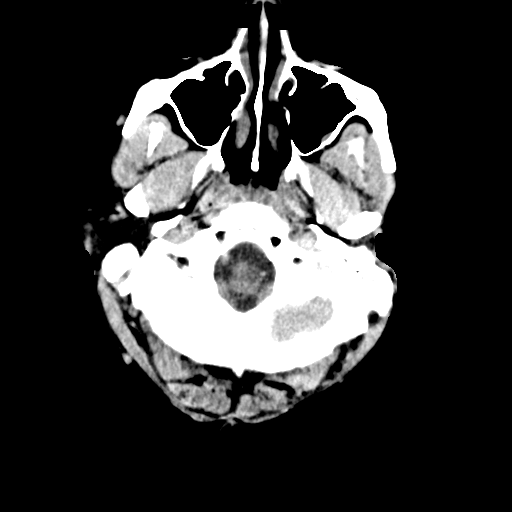
[im 5/38  bone]
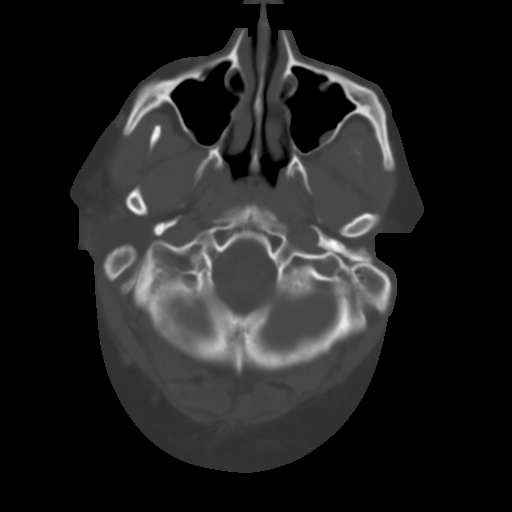
[im 10/38  brain]
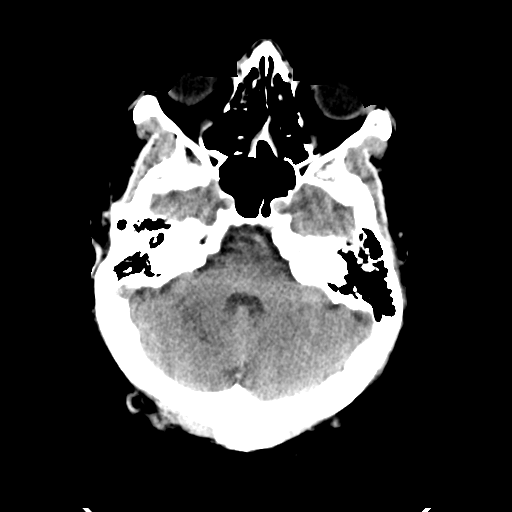
[im 14/38  brain]
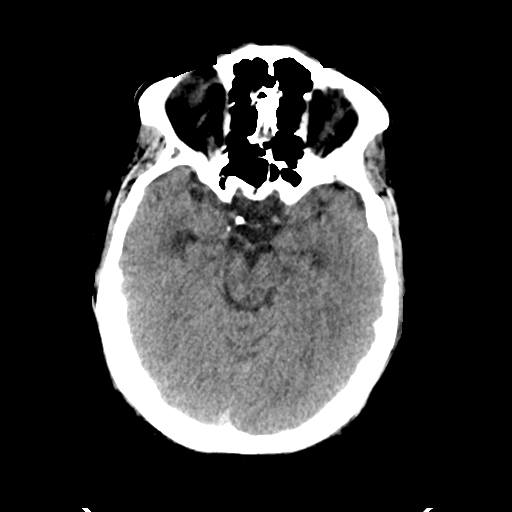
[im 19/38  brain]
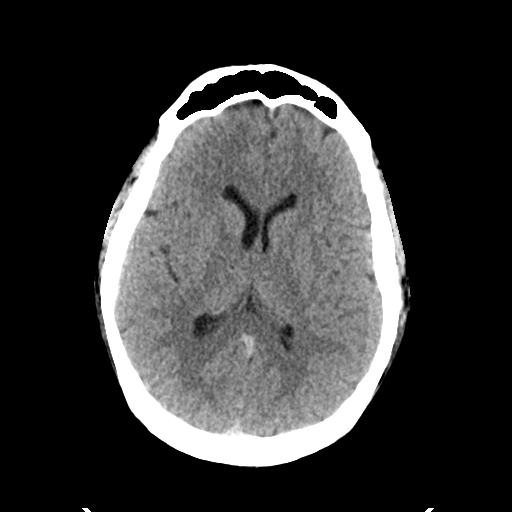
[im 24/38  brain]
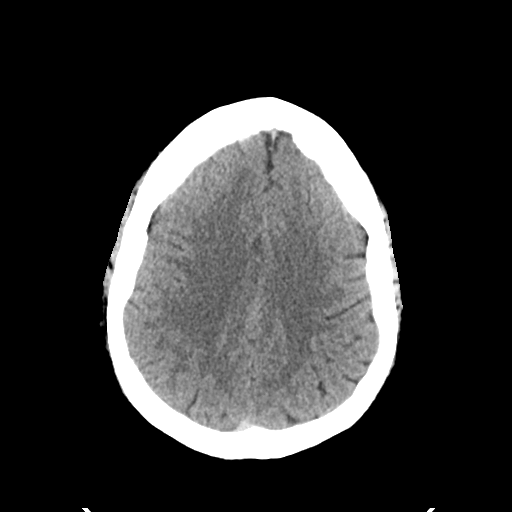
[im 24/38  bone]
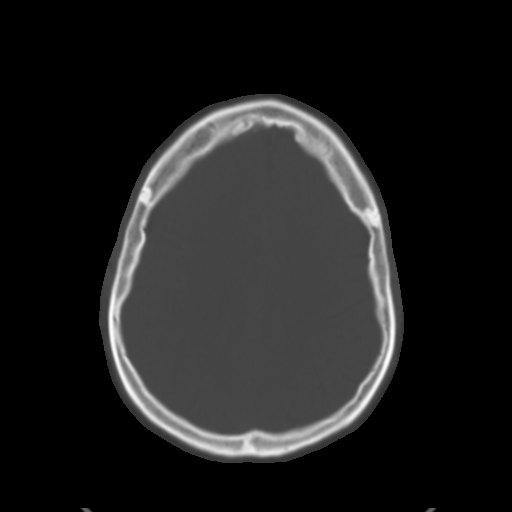
[im 28/38  brain]
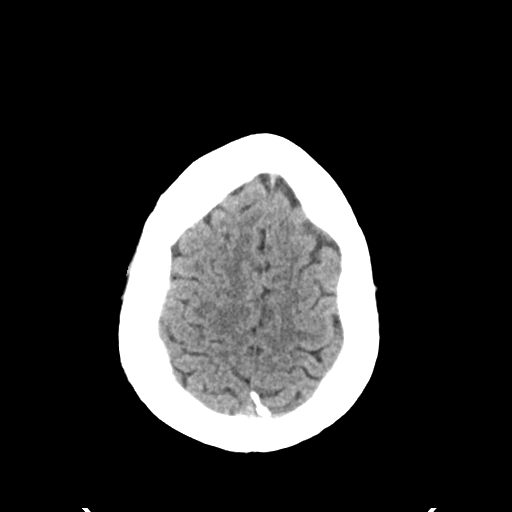
[im 33/38  brain]
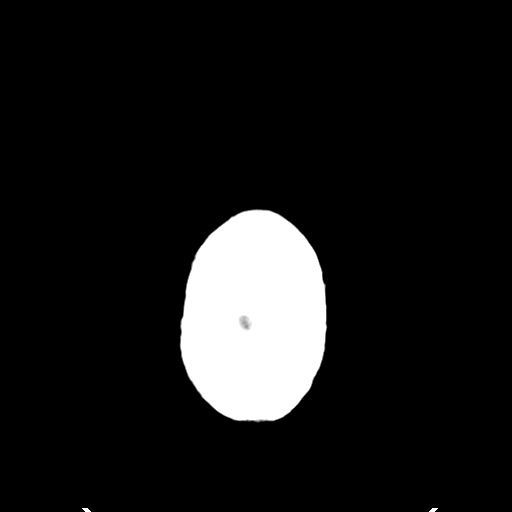

[Series 4: head bone · axial · 0.48mm/px · z∈[-113,-49]mm · 4 of 94 slices shown]
[im 10/94  bone]
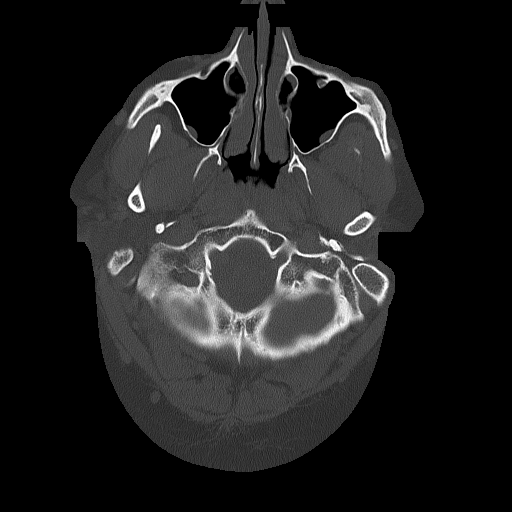
[im 19/94  bone]
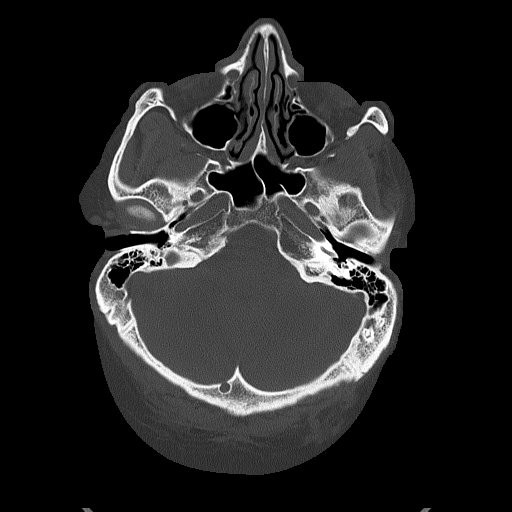
[im 28/94  bone]
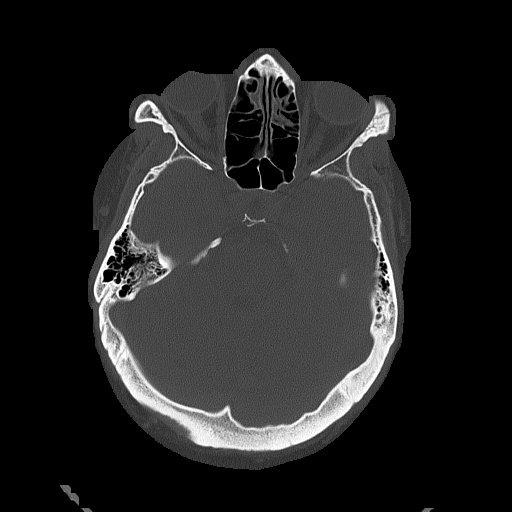
[im 42/94  bone]
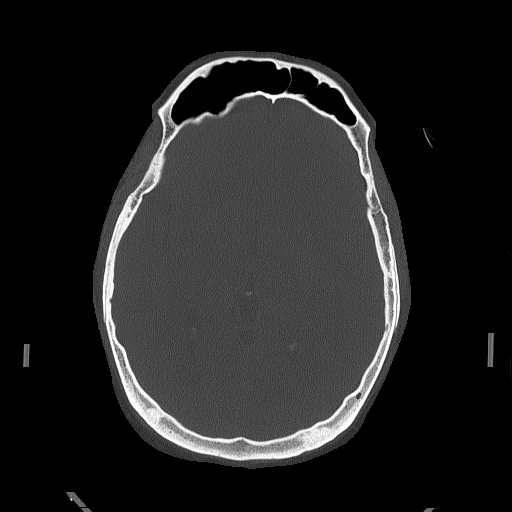

[Series 5: head without cor · coronal · non-contrast · 0.37mm/px · 3 of 80 slices shown]
[im 27/80  brain]
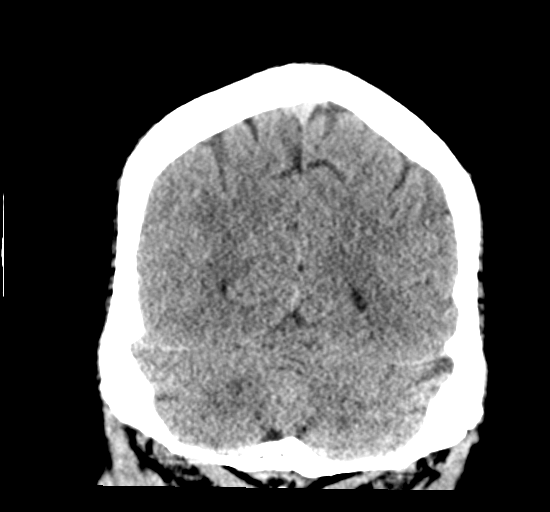
[im 36/80  brain]
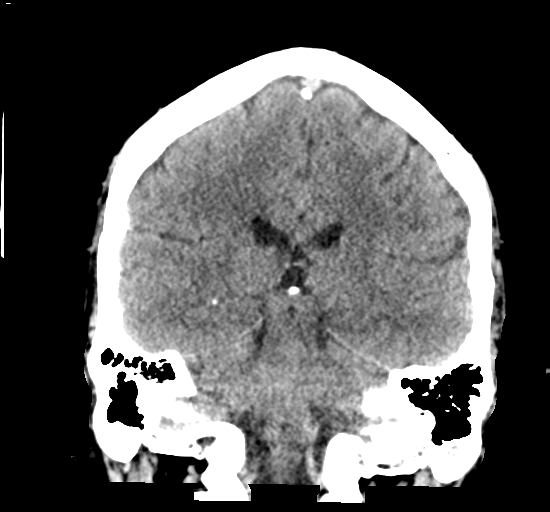
[im 44/80  brain]
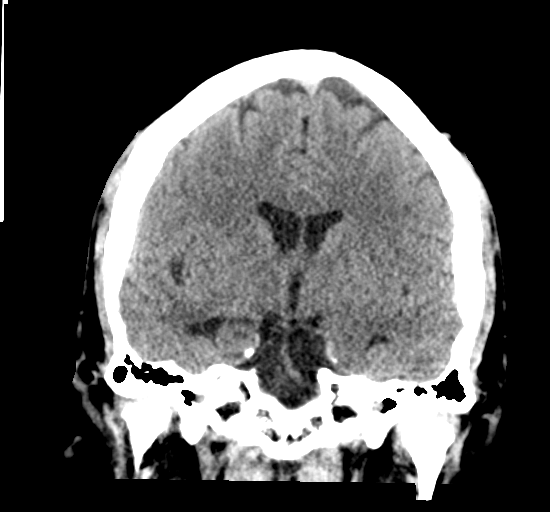

[Series 6: head without sag · sagittal · non-contrast · 0.37mm/px · 3 of 63 slices shown]
[im 21/63  brain]
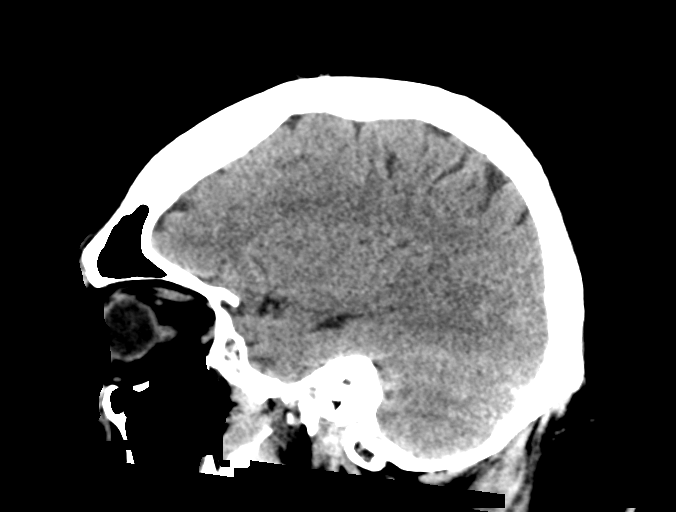
[im 32/63  brain]
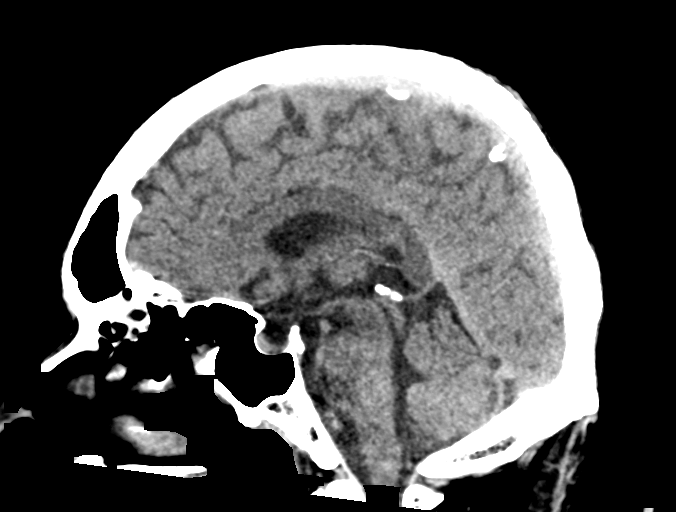
[im 42/63  brain]
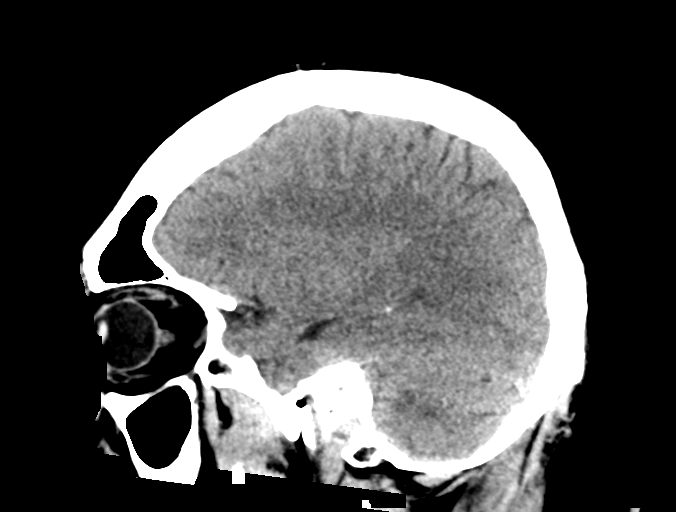

[17 of 47 positions shown; findings below may reference images not displayed]

FINDINGS: Brain: No evidence of acute infarction, hemorrhage, hydrocephalus,
extra-axial collection or mass lesion/mass effect.

Vascular: No hyperdense vessel or unexpected calcification.

Skull: Normal. Negative for fracture or focal lesion.

Sinuses/Orbits: Mucosal thickening of the LEFT greater than RIGHT
maxillary sinus.

Other: None.
IMPRESSION: No acute intracranial abnormality.

## 2022-11-04 ENCOUNTER — Ambulatory Visit: Payer: 59 | Admitting: Podiatry

## 2022-11-04 ENCOUNTER — Ambulatory Visit: Payer: 59 | Admitting: Nurse Practitioner

## 2023-04-23 ENCOUNTER — Other Ambulatory Visit: Payer: Self-pay

## 2023-04-23 ENCOUNTER — Emergency Department
Admission: EM | Admit: 2023-04-23 | Discharge: 2023-04-23 | Disposition: A | Discharge status: Home / Self Care | Payer: 59 | Attending: Emergency Medicine | Admitting: Emergency Medicine

## 2023-04-23 DIAGNOSIS — M542 Cervicalgia: Secondary | ICD-10-CM | POA: Diagnosis not present

## 2023-04-23 DIAGNOSIS — R202 Paresthesia of skin: Secondary | ICD-10-CM | POA: Diagnosis present

## 2023-04-23 LAB — CBC WITH DIFFERENTIAL/PLATELET
Abs Immature Granulocytes: 0.05 10*3/uL (ref 0.00–0.07)
Basophils Absolute: 0 10*3/uL (ref 0.0–0.1)
Basophils Relative: 0 %
Eosinophils Absolute: 0 10*3/uL (ref 0.0–0.5)
Eosinophils Relative: 0 %
HCT: 42.5 % (ref 39.0–52.0)
Hemoglobin: 14.8 g/dL (ref 13.0–17.0)
Immature Granulocytes: 0 %
Lymphocytes Relative: 17 %
Lymphs Abs: 2.1 10*3/uL (ref 0.7–4.0)
MCH: 32.1 pg (ref 26.0–34.0)
MCHC: 34.8 g/dL (ref 30.0–36.0)
MCV: 92.2 fL (ref 80.0–100.0)
Monocytes Absolute: 0.5 10*3/uL (ref 0.1–1.0)
Monocytes Relative: 4 %
Neutro Abs: 9.8 10*3/uL — ABNORMAL HIGH (ref 1.7–7.7)
Neutrophils Relative %: 79 %
Platelets: 274 10*3/uL (ref 150–400)
RBC: 4.61 MIL/uL (ref 4.22–5.81)
RDW: 12.5 % (ref 11.5–15.5)
WBC: 12.5 10*3/uL — ABNORMAL HIGH (ref 4.0–10.5)
nRBC: 0 % (ref 0.0–0.2)

## 2023-04-23 LAB — BASIC METABOLIC PANEL
Anion gap: 12 (ref 5–15)
BUN: 12 mg/dL (ref 6–20)
CO2: 24 mmol/L (ref 22–32)
Calcium: 9.4 mg/dL (ref 8.9–10.3)
Chloride: 102 mmol/L (ref 98–111)
Creatinine, Ser: 0.94 mg/dL (ref 0.61–1.24)
GFR, Estimated: >60 mL/min (ref >60)
Glucose, Bld: 138 mg/dL — ABNORMAL HIGH (ref 70–99)
Potassium: 3.6 mmol/L (ref 3.5–5.1)
Sodium: 138 mmol/L (ref 135–145)

## 2023-04-23 LAB — TROPONIN I (HIGH SENSITIVITY): Troponin I (High Sensitivity): 5 ng/L (ref <18)

## 2023-04-23 LAB — CBG MONITORING, ED: Glucose-Capillary: 152 mg/dL — ABNORMAL HIGH (ref 70–99)

## 2023-04-23 NOTE — ED Notes (Addendum)
Per MD Braddler no CT Head at this time

## 2023-04-23 NOTE — ED Triage Notes (Signed)
Pt reports L arm and L leg weakness that began around 1900 tonight. Pt denies headache, speech is clear. Pt ambulatory to triage. Pt denies chest pain, reports having difficulty taking deep breath.

## 2023-04-23 NOTE — ED Provider Notes (Signed)
[facility] Provider Note   Event Date/Time   First MD Initiated Contact with Patient 04/23/23 2200     (approximate) History  Weakness  HPI Clarissa Acha Jaymari Mancera. is a 32 y.o. male with no stated past medical history presents for paresthesia to the triceps region of the left arm that occurred for approximately 15 minutes and resolved spontaneously.  Patient states that after this paresthesia started he began to hyperventilate and began having chest pressure and tingling to the left lower extremity.  All of the symptoms did improve as patient's paresthesia improved.  Patient denies any symptoms similar to this in the past and does endorse some chronic neck pain. ROS: Patient currently denies any vision changes, tinnitus, difficulty speaking, facial droop, sore throat, chest pain, shortness of breath, abdominal pain, nausea/vomiting/diarrhea, dysuria, or weakness/numbness in any extremity   Physical Exam  Triage Vital Signs: ED Triage Vitals [04/23/23 1949]  Encounter Vitals Group     BP (!) 151/103     Systolic BP Percentile      Diastolic BP Percentile      Pulse Rate (!) 108     Resp 18     Temp 97.9 F (36.6 C)     Temp Source Oral     SpO2 99 %     Weight 240 lb (108.9 kg)     Height 6\' 2"  (1.88 m)     Head Circumference      Peak Flow      Pain Score 0     Pain Loc      Pain Education      Exclude from Growth Chart    Most recent vital signs: Vitals:   04/23/23 1949  BP: (!) 151/103  Pulse: (!) 108  Resp: 18  Temp: 97.9 F (36.6 C)  SpO2: 99%   General: Awake, oriented x4. CV:  Good peripheral perfusion.  Resp:  Normal effort.  Abd:  No distention.  Other:  Middle-aged obese Hispanic male resting comfortably in no acute distress.  NIHSS 0 ED Results / Procedures / Treatments  Labs (all labs ordered are listed, but only abnormal results are displayed) Labs Reviewed  BASIC METABOLIC PANEL - Abnormal; Notable for the following  components:      Result Value   Glucose, Bld 138 (*)    All other components within normal limits  CBC WITH DIFFERENTIAL/PLATELET - Abnormal; Notable for the following components:   WBC 12.5 (*)    Neutro Abs 9.8 (*)    All other components within normal limits  CBG MONITORING, ED - Abnormal; Notable for the following components:   Glucose-Capillary 152 (*)    All other components within normal limits  URINALYSIS, ROUTINE W REFLEX MICROSCOPIC  TROPONIN I (HIGH SENSITIVITY)  TROPONIN I (HIGH SENSITIVITY)   EKG ED ECG REPORT I, Merwyn Katos, the attending physician, personally viewed and interpreted this ECG. Date: 04/23/2023 EKG Time: 1953 Rate: 103 Rhythm: Tachycardic sinus rhythm QRS Axis: normal Intervals: normal ST/T Wave abnormalities: normal Narrative Interpretation: Tachycardic sinus rhythm.  No evidence of acute ischemia PROCEDURES: Critical Care performed: No .1-3 Lead EKG Interpretation  Performed by: Merwyn Katos, MD Authorized by: Merwyn Katos, MD     Interpretation: normal     ECG rate:  111   ECG rate assessment: tachycardic     Rhythm: sinus tachycardia     Ectopy: none     Conduction: normal    MEDICATIONS ORDERED IN ED:  Medications - No data to display IMPRESSION / MDM / ASSESSMENT AND PLAN / ED COURSE  I reviewed the triage vital signs and the nursing notes.                             The patient is on the cardiac monitor to evaluate for evidence of arrhythmia and/or significant heart rate changes. Patient's presentation is most consistent with acute presentation with potential threat to life or bodily function. Presents with sensation of tingling to left upper extremity. Sensation resolved spontaneously however did seem to result in a panic attack.  Patient's troponin level is negative.  Patient does have slight leukocytosis to 12.5.  Patient encouraged to follow-up with his primary care provider if the symptoms recur or persist. Patient's  symptoms not typical for other emergent causes such as stroke, dissection, infection, DKA, trauma. Patient will be discharged with strict return precautions and follow up with primary MD within 24 hours for further evaluation.   FINAL CLINICAL IMPRESSION(S) / ED DIAGNOSES   Final diagnoses:  Paresthesia of left upper extremity   Rx / DC Orders   ED Discharge Orders     None      Note:  This document was prepared using Dragon voice recognition software and may include unintentional dictation errors.   Merwyn Katos, MD 04/23/23 [phone]

## 2023-10-22 ENCOUNTER — Ambulatory Visit
Admission: RE | Admit: 2023-10-22 | Discharge: 2023-10-22 | Disposition: A | Discharge status: Home / Self Care | Source: Ambulatory Visit

## 2023-10-22 VITALS — BP 123/76 | HR 72 | Temp 98.4°F | Resp 18

## 2023-10-22 DIAGNOSIS — R21 Rash and other nonspecific skin eruption: Secondary | ICD-10-CM | POA: Diagnosis not present

## 2023-10-22 NOTE — Discharge Instructions (Signed)
 Follow up with your primary care provider tomorrow.  Go to the emergency department if you have worsening symptoms.    Your blood work is pending (CBC and CMP).  The strep test is negative.

## 2023-10-22 NOTE — ED Provider Notes (Signed)
 Craig Davis    CSN: [id number] Arrival date & time: 10/22/23  1638      History   Chief Complaint Chief Complaint  Patient presents with   Rash    Rash on face - Entered by patient    HPI Craig Davis. is a 33 y.o. male.  Patient presents with 1-2 week history of pruritic rash on his forehead and neck.  No new products or foods.  He increased his dosage of Lamictal 2 weeks ago as directed by his neurologist.  No OTC medications taken.  He denies fever, chills, skin sloughing, hives, arthralgias, sore throat, cough, shortness of breath, chest pain.  Patient states he feels well other than the rash.    Patient was seen by his neurologist at [facility] clinic on 07/27/2023; diagnosed with seizure-like activity, tongue biting, stress; started on Effexor, Keppra , Lamictal.  He had a telemedicine visit with his neurologist on 09/17/2023; diagnosed with seizure-like activity, tongue biting, stress, occasional tremors, shaking, medication side effect, situational depression, anxiety, mood disorder; Keppra  decreased and Lamictal increased.  The history is provided by the patient and medical records.    Past Medical History:  Diagnosis Date   Gout    Seizures [facility])     Patient Active Problem List   Diagnosis Date Noted   Seizure (HCC) 05/07/2020   Status post vasectomy 03/23/2020   Bursitis of elbow 01/09/2020   Class 2 obesity with body mass index (BMI) of 39.0 to 39.9 in adult 02/11/2019   Foot pain, bilateral 02/11/2019   Idiopathic gout of multiple sites 02/11/2019    Past Surgical History:  Procedure Laterality Date   TONSILLECTOMY     VASECTOMY         Home Medications    Prior to Admission medications   Medication Sig Start Date End Date Taking? Authorizing Provider  escitalopram (LEXAPRO) 10 MG tablet Take 1 tablet by mouth daily. 08/05/23  Yes [provider]  lamoTRIgine (LAMICTAL) 150 MG tablet Take 1 tablet by mouth 2 (two) times daily.  09/21/23  Yes [provider]  allopurinol  (ZYLOPRIM ) 100 MG tablet Take 1 tablet by mouth daily. Patient not taking: Reported on 10/22/2023 09/20/20   [provider]  allopurinol  (ZYLOPRIM ) 100 MG tablet Take 1 tablet (100 mg total) by mouth daily. Patient not taking: Reported on 10/22/2023 07/16/22   Velma Ghazi, DPM  allopurinol  (ZYLOPRIM ) 100 MG tablet Take 1 tablet (100 mg total) by mouth daily. Patient not taking: Reported on 10/22/2023 07/16/22   Velma Ghazi, DPM  levETIRAcetam  (KEPPRA ) 500 MG tablet Take 500 mg by mouth 2 (two) times daily. Patient not taking: Reported on 10/22/2023 11/02/21   [provider]  methylPREDNISolone  (MEDROL  DOSEPAK) 4 MG TBPK tablet Take as directed Patient not taking: Reported on 10/22/2023 07/16/22   Velma Ghazi, DPM    Family History Family History  Problem Relation Age of Onset   Autism Brother     Social History Social History   Tobacco Use   Smoking status: Former    Types: Cigarettes   Smokeless tobacco: Never   Tobacco comments:    2 years when younger  Vaping Use   Vaping status: Every Day  Substance Use Topics   Alcohol use: Yes    Comment: 05/28/20 not often   Drug use: Yes    Types: Marijuana    Comment: 05/28/20 3 x week     Allergies   Patient has no known  allergies.   Review of Systems Review of Systems  Constitutional:  Negative for chills and fever.  HENT:  Negative for sore throat and trouble swallowing.   Respiratory:  Negative for cough and shortness of breath.   Cardiovascular:  Negative for chest pain and palpitations.  Gastrointestinal:  Negative for abdominal pain, diarrhea and vomiting.  Musculoskeletal:  Negative for arthralgias and joint swelling.  Skin:  Positive for color change and rash.  Neurological:  Negative for weakness and numbness.     Physical Exam Triage Vital Signs ED Triage Vitals  Encounter Vitals Group     BP 10/22/23 1649 123/76     Systolic BP  Percentile --      Diastolic BP Percentile --      Pulse Rate 10/22/23 1649 72     Resp 10/22/23 1649 18     Temp 10/22/23 1649 98.4 F (36.9 C)     Temp src --      SpO2 10/22/23 1649 98 %     Weight --      Height --      Head Circumference --      Peak Flow --      Pain Score 10/22/23 1704 0     Pain Loc --      Pain Education --      Exclude from Growth Chart --    No data found.  Updated Vital Signs BP 123/76   Pulse 72   Temp 98.4 F (36.9 C)   Resp 18   SpO2 98%   Visual Acuity Right Eye Distance:   Left Eye Distance:   Bilateral Distance:    Right Eye Near:   Left Eye Near:    Bilateral Near:     Physical Exam Constitutional:      General: He is not in acute distress. HENT:     Mouth/Throat:     Mouth: Mucous membranes are moist.     Pharynx: Oropharynx is clear.  Cardiovascular:     Rate and Rhythm: Normal rate and regular rhythm.     Heart sounds: Normal heart sounds.  Pulmonary:     Effort: Pulmonary effort is normal. No respiratory distress.     Breath sounds: Normal breath sounds.  Skin:    General: Skin is warm and dry.     Capillary Refill: Capillary refill takes less than 2 seconds.     Findings: Rash present.     Comments: Flesh colored and pink maculopapular rash on face, neck, chest.  Few faint petechiae on upper chest.  No open wounds or drainage.  Neurological:     General: No focal deficit present.     Mental Status: He is alert and oriented to person, place, and time.     Gait: Gait normal.         UC Treatments / Results  Labs (all labs ordered are listed, but only abnormal results are displayed) Labs Reviewed  CBC WITH DIFFERENTIAL/PLATELET  COMPREHENSIVE METABOLIC PANEL WITH GFR    EKG   Radiology No results found.  Procedures Procedures (including critical care time)  Medications Ordered in UC Medications - No data to display  Initial Impression / Assessment and Plan / UC Course  I have reviewed the  triage vital signs and the nursing notes.  Pertinent labs & imaging results that were available during my care of the patient were reviewed by me and considered in my medical decision making (see chart for details).  Rash.  Patient is on Lamictal and his dosage was increased 2 to 3 weeks ago.  Discussed possibility of rashes related to Lamictal.  Discussed limitations of evaluation in an urgent care setting and that lab work would not be back until tomorrow at the earliest.  He declines transfer to the ED.  Afebrile and vital signs are stable.  Patient is well-appearing other than rash and states that he feels well.  CBC and CMP pending.  Rapid strep negative.  Instructed patient to follow-up with his PCP tomorrow.  Strict ED precautions discussed.  He agrees to plan of care.  Final Clinical Impressions(s) / UC Diagnoses   Final diagnoses:  Rash     Discharge Instructions      Follow up with your primary care provider tomorrow.  Go to the emergency department if you have worsening symptoms.    Your blood work is pending (CBC and CMP).  The strep test is negative.     ED Prescriptions   None    PDMP not reviewed this encounter.   Wellington Half, NP 10/22/23 1800

## 2023-10-22 NOTE — ED Triage Notes (Signed)
 Patient to Urgent Care with complaints of itchy rash to forehead-neck. Symptoms started x1 week.   Denies any new product usage.  Taking 150mg  Lamictal (x2 weeks). Contacted his neurologist who recommended he be evaluated at [facility].

## 2023-10-23 LAB — CBC WITH DIFFERENTIAL/PLATELET
Basophils Absolute: 0 10*3/uL (ref 0.0–0.2)
Basos: 1 %
EOS (ABSOLUTE): 0.5 10*3/uL — ABNORMAL HIGH (ref 0.0–0.4)
Eos: 6 %
Hematocrit: 42.2 % (ref 37.5–51.0)
Hemoglobin: 14.1 g/dL (ref 13.0–17.7)
Immature Grans (Abs): 0 10*3/uL (ref 0.0–0.1)
Immature Granulocytes: 0 %
Lymphocytes Absolute: 2.7 10*3/uL (ref 0.7–3.1)
Lymphs: 34 %
MCH: 31.8 pg (ref 26.6–33.0)
MCHC: 33.4 g/dL (ref 31.5–35.7)
MCV: 95 fL (ref 79–97)
Monocytes Absolute: 0.6 10*3/uL (ref 0.1–0.9)
Monocytes: 8 %
Neutrophils Absolute: 3.9 10*3/uL (ref 1.4–7.0)
Neutrophils: 51 %
Platelets: 248 10*3/uL (ref 150–450)
RBC: 4.43 x10E6/uL (ref 4.14–5.80)
RDW: 12.1 % (ref 11.6–15.4)
WBC: 7.7 10*3/uL (ref 3.4–10.8)

## 2023-10-23 LAB — COMPREHENSIVE METABOLIC PANEL WITH GFR
ALT: 18 IU/L (ref 0–44)
AST: 19 IU/L (ref 0–40)
Albumin: 4.6 g/dL (ref 4.1–5.1)
Alkaline Phosphatase: 58 IU/L (ref 44–121)
BUN/Creatinine Ratio: 17 (ref 9–20)
BUN: 14 mg/dL (ref 6–20)
Bilirubin Total: 0.2 mg/dL (ref 0.0–1.2)
CO2: 23 mmol/L (ref 20–29)
Calcium: 9.5 mg/dL (ref 8.7–10.2)
Chloride: 102 mmol/L (ref 96–106)
Creatinine, Ser: 0.84 mg/dL (ref 0.76–1.27)
Globulin, Total: 2.5 g/dL (ref 1.5–4.5)
Glucose: 85 mg/dL (ref 70–99)
Potassium: 4.5 mmol/L (ref 3.5–5.2)
Sodium: 139 mmol/L (ref 134–144)
Total Protein: 7.1 g/dL (ref 6.0–8.5)
eGFR: 117 mL/min/{1.73_m2} (ref >59)

## 2023-10-26 ENCOUNTER — Ambulatory Visit (HOSPITAL_COMMUNITY): Payer: Self-pay

## 2024-01-27 ENCOUNTER — Ambulatory Visit: Admitting: Psychiatry

## 2024-02-10 ENCOUNTER — Telehealth: Payer: Self-pay | Admitting: Podiatry

## 2024-02-10 ENCOUNTER — Ambulatory Visit (INDEPENDENT_AMBULATORY_CARE_PROVIDER_SITE_OTHER): Admitting: Podiatry

## 2024-02-10 DIAGNOSIS — M7751 Other enthesopathy of right foot: Secondary | ICD-10-CM | POA: Diagnosis not present

## 2024-02-10 DIAGNOSIS — M10071 Idiopathic gout, right ankle and foot: Secondary | ICD-10-CM | POA: Diagnosis not present

## 2024-02-10 NOTE — Progress Notes (Signed)
 Subjective:  Patient ID: Craig Detra Lenette Mickey., male    DOB: 1991/04/14,  MRN: [id number]  Chief Complaint  Patient presents with   Toe Pain    Left foot toe pain pt stated that it started to turn red and swell     33 y.o. male presents with the above complaint.  Patient presents with complaint right second metatarsophalangeal joint capsulitis with underlying redness.  He wanted to get it evaluated is causing some discomfort and swelling.  He has a history of gout.  He had a similar episode last year as well.  Steroid injection seems to help.  He denies any other acute complaints he is wearing his orthotics.   Review of Systems: Negative except as noted in the HPI. Denies N/V/F/Ch.  Past Medical History:  Diagnosis Date   Gout    Seizures (HCC)     Current Outpatient Medications:    allopurinol  (ZYLOPRIM ) 100 MG tablet, Take 1 tablet by mouth daily. (Patient not taking: Reported on 10/22/2023), Disp: , Rfl:    allopurinol  (ZYLOPRIM ) 100 MG tablet, Take 1 tablet (100 mg total) by mouth daily. (Patient not taking: Reported on 10/22/2023), Disp: 30 tablet, Rfl: 6   allopurinol  (ZYLOPRIM ) 100 MG tablet, Take 1 tablet (100 mg total) by mouth daily. (Patient not taking: Reported on 10/22/2023), Disp: 30 tablet, Rfl: 6   escitalopram (LEXAPRO) 10 MG tablet, Take 1 tablet by mouth daily., Disp: , Rfl:    lamoTRIgine (LAMICTAL) 150 MG tablet, Take 1 tablet by mouth 2 (two) times daily., Disp: , Rfl:    levETIRAcetam  (KEPPRA ) 500 MG tablet, Take 500 mg by mouth 2 (two) times daily. (Patient not taking: Reported on 10/22/2023), Disp: , Rfl:    methylPREDNISolone  (MEDROL  DOSEPAK) 4 MG TBPK tablet, Take as directed (Patient not taking: Reported on 10/22/2023), Disp: 21 each, Rfl: 0  Social History   Tobacco Use  Smoking Status Former   Types: Cigarettes  Smokeless Tobacco Never  Tobacco Comments   2 years when younger    No Known Allergies Objective:  There were no vitals filed for this  visit. There is no height or weight on file to calculate BMI. Constitutional Well developed. Well nourished.  Vascular Dorsalis pedis pulses palpable bilaterally. Posterior tibial pulses palpable bilaterally. Capillary refill normal to all digits.  No cyanosis or clubbing noted. Pedal hair growth normal.  Neurologic Normal speech. Oriented to person, place, and time. Epicritic sensation to light touch grossly present bilaterally.  Dermatologic Nails well groomed and normal in appearance. No open wounds. No skin lesions.  Orthopedic: Pain on palpation right second metatarsophalangeal joint.  Pain with range of motion of the joint no deep intra-articular pain noted pain dorsally as opposed to plantarly.  No flexor or extensor tendinitis noted.   Radiographs: None Assessment:   1. Capsulitis of metatarsophalangeal (MTP) joint of right foot   2. Acute idiopathic gout involving toe of right foot    Plan:  Patient was evaluated and treated and all questions answered.  Right second metatarsophalangeal joint capsulitis with history of gout - All questions and concerns were discussed with the patient in extensive detail given the amount of pain that he has he will benefit from steroid injection of decreasing inflammatory component surgical pain.  Patient agrees with plan to proceed with steroid injection -A steroid injection was performed at right second MTP using 1% plain Lidocaine and 10 mg of Kenalog. This was well tolerated. - I rediscussed gout diet as well as  prevention techniques he states understanding No follow-ups on file.

## 2024-02-10 NOTE — Telephone Encounter (Signed)
 Patient is requesting to be seen right away, pain in toe, swelling, redness, willing to come to [place]. Dr. Anthony patient

## 2024-02-11 ENCOUNTER — Ambulatory Visit: Admitting: Podiatry

## 2024-03-10 ENCOUNTER — Ambulatory Visit: Admitting: Podiatry

## 2024-05-19 ENCOUNTER — Ambulatory Visit (INDEPENDENT_AMBULATORY_CARE_PROVIDER_SITE_OTHER)

## 2024-05-19 ENCOUNTER — Ambulatory Visit

## 2024-05-19 ENCOUNTER — Ambulatory Visit: Admitting: Podiatry

## 2024-05-19 DIAGNOSIS — M19071 Primary osteoarthritis, right ankle and foot: Secondary | ICD-10-CM

## 2024-05-19 DIAGNOSIS — M10071 Idiopathic gout, right ankle and foot: Secondary | ICD-10-CM

## 2024-05-19 DIAGNOSIS — M10171 Lead-induced gout, right ankle and foot: Secondary | ICD-10-CM

## 2024-05-19 DIAGNOSIS — M7751 Other enthesopathy of right foot: Secondary | ICD-10-CM

## 2024-05-19 DIAGNOSIS — R52 Pain, unspecified: Secondary | ICD-10-CM

## 2024-05-19 MED ORDER — MELOXICAM 15 MG PO TABS: 15.0000 mg | ORAL_TABLET | Freq: Every day | ORAL | 0 refills | Status: AC | PRN

## 2024-05-19 MED ADMIN — Triamcinolone Acetonide Inj Susp 10 MG/ML: 10 mg | INTRAMUSCULAR | @ 17:00:00 | NDC 00003049420

## 2024-05-19 NOTE — Patient Instructions (Signed)

## 2024-05-22 NOTE — Progress Notes (Signed)
 Subjective:   Patient ID: Craig Detra Lenette Mickey., male   DOB: 33 y.o.   MRN: [id number]   HPI Chief Complaint  Patient presents with   Foot Pain    Right foot, primarily at base of ankle when pushing gas pedal or flexing the foot.    33 year old male presents the office today with concerns of right ankle pain pointing directly on the anterior lateral aspect of ankle which started yesterday.  He does not recall any recent injuries.  He has had a history of gout but this is not feeling a typical gout flare.  Does not note any change in activities.  No recent treatment.   Review of Systems  All other systems reviewed and are negative.  Past Medical History:  Diagnosis Date   Gout    Seizures (HCC)     Past Surgical History:  Procedure Laterality Date   TONSILLECTOMY     VASECTOMY      Current Medications[1]  Allergies[2]        Objective:  Physical Exam  General: AAO x3, NAD  Dermatological: Skin is warm, dry and supple bilateral. There are no open sores, no preulcerative lesions, no rash or signs of infection present.  Vascular: Dorsalis Pedis artery and Posterior Tibial artery pedal pulses are 2/4 bilateral with immedate capillary fill time. There is no pain with calf compression, swelling, warmth, erythema.   Neruologic: Grossly intact via light touch bilateral.   Musculoskeletal: Tenderness roughly on the anterolateral aspect the ankle joint.  There is no pain directly along the fibula, tibia.  There is decreased subtalar joint range of motion.  Ankle range of motion intact.       Assessment:   Capsulitis right ankle     Plan:  -Treatment options discussed including all alternatives, risks, and complications -Etiology of symptoms were discussed -X-rays were obtained and reviewed with the patient.  Multiple views of the right foot and ankle were obtained.  On the lateral view there is prominence along the posterior facet along the subtalar joint.  There is  joint space narrowing present along the ankle joint as well as the subtalar joint.  There is no evidence of acute fracture. -Discussed steroid injection.  He wishes to proceed.  Cleaned the skin with Betadine, alcohol.  Mixture 1 cc Kenalog  10, 0.5 cc of Marcaine plain, 0.5 cc of lidocaine plain was infiltrated into the anterior lateral aspect ankle joint complications.  Postinjection care discussed.  Tolerated well. -He has an ankle brace at home he will continue with this.  History of gout -Will get updated uric acid level checked -Asking for refill allopurinol  will check that today.  Return in about 4 weeks (around 06/16/2024) for ankle pain with Dr. Tobie in [place] .  Donnice JONELLE Fees DPM         [1]  Current Outpatient Medications:    allopurinol  (ZYLOPRIM ) 100 MG tablet, Take 1 tablet (100 mg total) by mouth daily., Disp: 90 tablet, Rfl: 0   escitalopram (LEXAPRO) 10 MG tablet, Take 1 tablet by mouth daily., Disp: , Rfl:    lamoTRIgine (LAMICTAL) 150 MG tablet, Take 1 tablet by mouth 2 (two) times daily., Disp: , Rfl:    meloxicam  (MOBIC ) 15 MG tablet, Take 1 tablet (15 mg total) by mouth daily as needed for pain., Disp: 30 tablet, Rfl: 0 [2] No Known Allergies

## 2024-06-16 ENCOUNTER — Ambulatory Visit: Admitting: Podiatry
